# Patient Record
Sex: Female | Born: 1955 | Race: White | Hispanic: No | Marital: Married | Smoking: Never smoker
Health system: Southern US, Community
[De-identification: ages and names within clinical notes are randomized; demographics above are authoritative.]

## PROBLEM LIST (undated history)

## (undated) DIAGNOSIS — E785 Hyperlipidemia, unspecified: Secondary | ICD-10-CM

## (undated) DIAGNOSIS — R011 Cardiac murmur, unspecified: Secondary | ICD-10-CM

## (undated) DIAGNOSIS — E039 Hypothyroidism, unspecified: Secondary | ICD-10-CM

## (undated) DIAGNOSIS — C4491 Basal cell carcinoma of skin, unspecified: Secondary | ICD-10-CM

## (undated) DIAGNOSIS — I10 Essential (primary) hypertension: Secondary | ICD-10-CM

## (undated) DIAGNOSIS — R55 Syncope and collapse: Secondary | ICD-10-CM

## (undated) HISTORY — PX: BACK SURGERY: SHX140

## (undated) HISTORY — PX: MOHS SURGERY: SUR867

## (undated) HISTORY — PX: TUBAL LIGATION: SHX77

---

## 1974-05-22 HISTORY — PX: APPENDECTOMY: SHX54

## 1991-05-23 HISTORY — PX: ABDOMINAL HYSTERECTOMY: SHX81

## 1998-09-30 ENCOUNTER — Ambulatory Visit: Admission: RE | Admit: 1998-09-30 | Discharge: 1998-09-30 | Payer: Self-pay | Admitting: General Surgery

## 1998-10-01 ENCOUNTER — Encounter: Payer: Self-pay | Admitting: General Surgery

## 1998-10-01 ENCOUNTER — Ambulatory Visit (HOSPITAL_COMMUNITY): Admission: RE | Admit: 1998-10-01 | Discharge: 1998-10-01 | Payer: Self-pay | Admitting: General Surgery

## 1998-10-11 ENCOUNTER — Ambulatory Visit (HOSPITAL_COMMUNITY): Admission: RE | Admit: 1998-10-11 | Discharge: 1998-10-11 | Payer: Self-pay | Admitting: General Surgery

## 1998-10-11 ENCOUNTER — Encounter: Payer: Self-pay | Admitting: General Surgery

## 1998-10-14 ENCOUNTER — Emergency Department (HOSPITAL_COMMUNITY): Admission: EM | Admit: 1998-10-14 | Discharge: 1998-10-14 | Payer: Self-pay | Admitting: Emergency Medicine

## 1999-04-13 ENCOUNTER — Inpatient Hospital Stay (HOSPITAL_COMMUNITY): Admission: AD | Admit: 1999-04-13 | Discharge: 1999-04-16 | Payer: Self-pay | Admitting: *Deleted

## 1999-04-15 ENCOUNTER — Encounter: Payer: Self-pay | Admitting: Neurology

## 1999-10-05 ENCOUNTER — Encounter: Admission: RE | Admit: 1999-10-05 | Discharge: 1999-10-05 | Payer: Self-pay | Admitting: Family Medicine

## 1999-10-05 ENCOUNTER — Encounter: Payer: Self-pay | Admitting: Family Medicine

## 2000-11-23 ENCOUNTER — Encounter: Payer: Self-pay | Admitting: Family Medicine

## 2000-11-23 ENCOUNTER — Encounter: Admission: RE | Admit: 2000-11-23 | Discharge: 2000-11-23 | Payer: Self-pay | Admitting: Family Medicine

## 2000-12-05 ENCOUNTER — Ambulatory Visit (HOSPITAL_COMMUNITY): Admission: RE | Admit: 2000-12-05 | Discharge: 2000-12-05 | Payer: Self-pay | Admitting: Family Medicine

## 2000-12-05 ENCOUNTER — Encounter: Payer: Self-pay | Admitting: Family Medicine

## 2001-11-24 ENCOUNTER — Emergency Department (HOSPITAL_COMMUNITY): Admission: EM | Admit: 2001-11-24 | Discharge: 2001-11-24 | Payer: Self-pay | Admitting: Emergency Medicine

## 2002-01-16 ENCOUNTER — Encounter: Admission: RE | Admit: 2002-01-16 | Discharge: 2002-01-16 | Payer: Self-pay | Admitting: Family Medicine

## 2002-01-16 ENCOUNTER — Encounter: Payer: Self-pay | Admitting: Family Medicine

## 2002-06-24 ENCOUNTER — Encounter: Payer: Self-pay | Admitting: Family Medicine

## 2002-06-24 ENCOUNTER — Ambulatory Visit (HOSPITAL_COMMUNITY): Admission: RE | Admit: 2002-06-24 | Discharge: 2002-06-24 | Payer: Self-pay | Admitting: Family Medicine

## 2003-04-08 ENCOUNTER — Encounter: Admission: RE | Admit: 2003-04-08 | Discharge: 2003-04-08 | Payer: Self-pay | Admitting: Family Medicine

## 2003-09-24 ENCOUNTER — Other Ambulatory Visit: Admission: RE | Admit: 2003-09-24 | Discharge: 2003-09-24 | Payer: Self-pay | Admitting: Family Medicine

## 2004-05-11 ENCOUNTER — Ambulatory Visit (HOSPITAL_COMMUNITY): Admission: RE | Admit: 2004-05-11 | Discharge: 2004-05-11 | Payer: Self-pay | Admitting: Family Medicine

## 2005-03-16 ENCOUNTER — Other Ambulatory Visit: Admission: RE | Admit: 2005-03-16 | Discharge: 2005-03-16 | Payer: Self-pay | Admitting: Family Medicine

## 2005-06-02 ENCOUNTER — Ambulatory Visit (HOSPITAL_COMMUNITY): Admission: RE | Admit: 2005-06-02 | Discharge: 2005-06-02 | Payer: Self-pay | Admitting: Family Medicine

## 2006-05-01 ENCOUNTER — Emergency Department (HOSPITAL_COMMUNITY): Admission: EM | Admit: 2006-05-01 | Discharge: 2006-05-01 | Payer: Self-pay | Admitting: Emergency Medicine

## 2006-06-14 ENCOUNTER — Ambulatory Visit (HOSPITAL_COMMUNITY): Admission: RE | Admit: 2006-06-14 | Discharge: 2006-06-14 | Payer: Self-pay | Admitting: Family Medicine

## 2006-07-04 ENCOUNTER — Encounter: Admission: RE | Admit: 2006-07-04 | Discharge: 2006-07-04 | Payer: Self-pay | Admitting: Family Medicine

## 2006-09-13 ENCOUNTER — Encounter: Admission: RE | Admit: 2006-09-13 | Discharge: 2006-11-07 | Payer: Self-pay | Admitting: Family Medicine

## 2007-03-13 ENCOUNTER — Other Ambulatory Visit: Admission: RE | Admit: 2007-03-13 | Discharge: 2007-03-13 | Payer: Self-pay | Admitting: Family Medicine

## 2007-07-08 ENCOUNTER — Ambulatory Visit (HOSPITAL_COMMUNITY): Admission: RE | Admit: 2007-07-08 | Discharge: 2007-07-08 | Payer: Self-pay | Admitting: Family Medicine

## 2007-09-28 ENCOUNTER — Encounter: Admission: RE | Admit: 2007-09-28 | Discharge: 2007-09-28 | Payer: Self-pay | Admitting: Family Medicine

## 2007-10-04 ENCOUNTER — Encounter: Admission: RE | Admit: 2007-10-04 | Discharge: 2007-10-04 | Payer: Self-pay | Admitting: Neurological Surgery

## 2007-10-21 HISTORY — PX: LAMINECTOMY AND MICRODISCECTOMY THORACIC SPINE: SHX1915

## 2007-10-30 ENCOUNTER — Ambulatory Visit: Payer: Self-pay | Admitting: Thoracic Surgery

## 2007-11-15 ENCOUNTER — Inpatient Hospital Stay (HOSPITAL_COMMUNITY): Admission: RE | Admit: 2007-11-15 | Discharge: 2007-11-20 | Payer: Self-pay | Admitting: Neurosurgery

## 2007-11-21 ENCOUNTER — Ambulatory Visit: Payer: Self-pay | Admitting: Thoracic Surgery

## 2007-11-24 ENCOUNTER — Emergency Department (HOSPITAL_COMMUNITY): Admission: EM | Admit: 2007-11-24 | Discharge: 2007-11-24 | Payer: Self-pay | Admitting: Emergency Medicine

## 2007-11-27 ENCOUNTER — Ambulatory Visit: Payer: Self-pay | Admitting: Thoracic Surgery

## 2007-12-04 ENCOUNTER — Encounter: Admission: RE | Admit: 2007-12-04 | Discharge: 2007-12-04 | Payer: Self-pay | Admitting: Thoracic Surgery

## 2007-12-04 ENCOUNTER — Ambulatory Visit: Payer: Self-pay | Admitting: Thoracic Surgery

## 2007-12-18 ENCOUNTER — Ambulatory Visit: Payer: Self-pay | Admitting: Thoracic Surgery

## 2007-12-18 ENCOUNTER — Encounter: Admission: RE | Admit: 2007-12-18 | Discharge: 2007-12-18 | Payer: Self-pay | Admitting: Thoracic Surgery

## 2007-12-24 ENCOUNTER — Encounter: Admission: RE | Admit: 2007-12-24 | Discharge: 2007-12-24 | Payer: Self-pay | Admitting: Thoracic Surgery

## 2007-12-24 ENCOUNTER — Ambulatory Visit: Payer: Self-pay | Admitting: Thoracic Surgery

## 2008-01-08 ENCOUNTER — Encounter: Admission: RE | Admit: 2008-01-08 | Discharge: 2008-01-08 | Payer: Self-pay | Admitting: Thoracic Surgery

## 2008-01-08 ENCOUNTER — Ambulatory Visit: Payer: Self-pay | Admitting: Thoracic Surgery

## 2008-01-29 ENCOUNTER — Ambulatory Visit: Payer: Self-pay | Admitting: Thoracic Surgery

## 2008-01-29 ENCOUNTER — Encounter: Admission: RE | Admit: 2008-01-29 | Discharge: 2008-01-29 | Payer: Self-pay | Admitting: Thoracic Surgery

## 2008-03-11 ENCOUNTER — Ambulatory Visit: Payer: Self-pay | Admitting: Thoracic Surgery

## 2008-03-11 ENCOUNTER — Encounter: Admission: RE | Admit: 2008-03-11 | Discharge: 2008-03-11 | Payer: Self-pay | Admitting: Thoracic Surgery

## 2008-05-13 ENCOUNTER — Ambulatory Visit: Payer: Self-pay | Admitting: Thoracic Surgery

## 2008-05-13 ENCOUNTER — Encounter: Admission: RE | Admit: 2008-05-13 | Discharge: 2008-05-13 | Payer: Self-pay | Admitting: Thoracic Surgery

## 2008-07-07 ENCOUNTER — Other Ambulatory Visit: Admission: RE | Admit: 2008-07-07 | Discharge: 2008-07-07 | Payer: Self-pay | Admitting: Family Medicine

## 2008-07-09 ENCOUNTER — Ambulatory Visit (HOSPITAL_COMMUNITY): Admission: RE | Admit: 2008-07-09 | Discharge: 2008-07-09 | Payer: Self-pay | Admitting: Family Medicine

## 2008-07-23 ENCOUNTER — Ambulatory Visit (HOSPITAL_COMMUNITY): Admission: RE | Admit: 2008-07-23 | Discharge: 2008-07-23 | Payer: Self-pay | Admitting: Family Medicine

## 2009-07-16 ENCOUNTER — Ambulatory Visit (HOSPITAL_COMMUNITY): Admission: RE | Admit: 2009-07-16 | Discharge: 2009-07-16 | Payer: Self-pay | Admitting: Family Medicine

## 2010-06-12 ENCOUNTER — Encounter: Payer: Self-pay | Admitting: Family Medicine

## 2010-06-13 ENCOUNTER — Encounter: Payer: Self-pay | Admitting: Thoracic Surgery

## 2010-06-22 HISTORY — PX: BASAL CELL CARCINOMA EXCISION: SHX1214

## 2010-07-05 ENCOUNTER — Inpatient Hospital Stay (HOSPITAL_COMMUNITY)
Admission: EM | Admit: 2010-07-05 | Discharge: 2010-07-08 | DRG: 278 | Disposition: A | Discharge status: Home / Self Care | Payer: BC Managed Care – PPO | Attending: Internal Medicine | Admitting: Internal Medicine

## 2010-07-05 ENCOUNTER — Emergency Department (HOSPITAL_COMMUNITY): Payer: BC Managed Care – PPO

## 2010-07-05 DIAGNOSIS — L02219 Cutaneous abscess of trunk, unspecified: Principal | ICD-10-CM | POA: Diagnosis present

## 2010-07-05 DIAGNOSIS — C44519 Basal cell carcinoma of skin of other part of trunk: Secondary | ICD-10-CM | POA: Diagnosis present

## 2010-07-05 DIAGNOSIS — E039 Hypothyroidism, unspecified: Secondary | ICD-10-CM | POA: Diagnosis present

## 2010-07-05 DIAGNOSIS — Z79890 Hormone replacement therapy (postmenopausal): Secondary | ICD-10-CM

## 2010-07-05 DIAGNOSIS — F3289 Other specified depressive episodes: Secondary | ICD-10-CM | POA: Diagnosis present

## 2010-07-05 DIAGNOSIS — A4901 Methicillin susceptible Staphylococcus aureus infection, unspecified site: Secondary | ICD-10-CM | POA: Diagnosis present

## 2010-07-05 DIAGNOSIS — Z79899 Other long term (current) drug therapy: Secondary | ICD-10-CM

## 2010-07-05 DIAGNOSIS — Z808 Family history of malignant neoplasm of other organs or systems: Secondary | ICD-10-CM

## 2010-07-05 DIAGNOSIS — L03319 Cellulitis of trunk, unspecified: Principal | ICD-10-CM | POA: Diagnosis present

## 2010-07-05 DIAGNOSIS — I10 Essential (primary) hypertension: Secondary | ICD-10-CM | POA: Diagnosis present

## 2010-07-05 DIAGNOSIS — C44711 Basal cell carcinoma of skin of unspecified lower limb, including hip: Secondary | ICD-10-CM | POA: Diagnosis present

## 2010-07-05 DIAGNOSIS — E7800 Pure hypercholesterolemia, unspecified: Secondary | ICD-10-CM | POA: Diagnosis present

## 2010-07-05 DIAGNOSIS — F329 Major depressive disorder, single episode, unspecified: Secondary | ICD-10-CM | POA: Diagnosis present

## 2010-07-05 LAB — DIFFERENTIAL
Basophils Relative: 0 % (ref 0–1)
Eosinophils Absolute: 0.2 10*3/uL (ref 0.0–0.7)
Eosinophils Relative: 1 % (ref 0–5)
Lymphs Abs: 2 10*3/uL (ref 0.7–4.0)
Monocytes Relative: 8 % (ref 3–12)
Neutrophils Relative %: 72 % (ref 43–77)

## 2010-07-05 LAB — BASIC METABOLIC PANEL
BUN: 6 mg/dL (ref 6–23)
Creatinine, Ser: 0.67 mg/dL (ref 0.4–1.2)
GFR calc Af Amer: >60 mL/min (ref >60)
GFR calc non Af Amer: >60 mL/min (ref >60)

## 2010-07-05 LAB — CBC
MCH: 32.3 pg (ref 26.0–34.0)
MCV: 93.8 fL (ref 78.0–100.0)
Platelets: 255 10*3/uL (ref 150–400)
RDW: 12.2 % (ref 11.5–15.5)
WBC: 10.8 10*3/uL — ABNORMAL HIGH (ref 4.0–10.5)

## 2010-07-05 LAB — HEPATIC FUNCTION PANEL
Albumin: 3.9 g/dL (ref 3.5–5.2)
Indirect Bilirubin: 0.3 mg/dL (ref 0.3–0.9)
Total Protein: 7.9 g/dL (ref 6.0–8.3)

## 2010-07-06 LAB — CBC
HCT: 33.8 % — ABNORMAL LOW (ref 36.0–46.0)
Hemoglobin: 11.5 g/dL — ABNORMAL LOW (ref 12.0–15.0)
RBC: 3.6 MIL/uL — ABNORMAL LOW (ref 3.87–5.11)
WBC: 9.6 10*3/uL (ref 4.0–10.5)

## 2010-07-06 LAB — BASIC METABOLIC PANEL
CO2: 28 mEq/L (ref 19–32)
Chloride: 101 mEq/L (ref 96–112)
GFR calc non Af Amer: >60 mL/min (ref >60)
Glucose, Bld: 107 mg/dL — ABNORMAL HIGH (ref 70–99)
Potassium: 4.1 mEq/L (ref 3.5–5.1)
Sodium: 137 mEq/L (ref 135–145)

## 2010-07-07 LAB — CBC
HCT: 35.2 % — ABNORMAL LOW (ref 36.0–46.0)
Hemoglobin: 12 g/dL (ref 12.0–15.0)
MCH: 32 pg (ref 26.0–34.0)
RBC: 3.75 MIL/uL — ABNORMAL LOW (ref 3.87–5.11)

## 2010-07-08 LAB — CBC
Hemoglobin: 12.2 g/dL (ref 12.0–15.0)
MCV: 94.1 fL (ref 78.0–100.0)
Platelets: 270 10*3/uL (ref 150–400)
RBC: 3.88 MIL/uL (ref 3.87–5.11)
WBC: 6.4 10*3/uL (ref 4.0–10.5)

## 2010-07-08 LAB — COMPREHENSIVE METABOLIC PANEL
AST: 27 U/L (ref 0–37)
Albumin: 3.1 g/dL — ABNORMAL LOW (ref 3.5–5.2)
Alkaline Phosphatase: 117 U/L (ref 39–117)
BUN: 11 mg/dL (ref 6–23)
CO2: 28 mEq/L (ref 19–32)
Chloride: 102 mEq/L (ref 96–112)
GFR calc non Af Amer: >60 mL/min (ref >60)
Potassium: 4.1 mEq/L (ref 3.5–5.1)
Total Bilirubin: 0.4 mg/dL (ref 0.3–1.2)

## 2010-07-08 LAB — WOUND CULTURE

## 2010-07-08 LAB — DIFFERENTIAL
Basophils Absolute: 0 10*3/uL (ref 0.0–0.1)
Eosinophils Absolute: 0.1 10*3/uL (ref 0.0–0.7)
Eosinophils Relative: 1 % (ref 0–5)
Lymphocytes Relative: 29 % (ref 12–46)

## 2010-07-12 LAB — CULTURE, BLOOD (ROUTINE X 2)
Culture  Setup Time: 201202150130
Culture: NO GROWTH
Culture: NO GROWTH

## 2010-07-16 NOTE — H&P (Signed)
NAMELEANDRIA, THIER NO.:  [id number]  MEDICAL RECORD NO.:  [id number]           PATIENT TYPE:  E  LOCATION:  WLED                         FACILITY:  [facility]  PHYSICIAN:  Vania Rea, M.D. DATE OF BIRTH:  March 06, 1956  DATE OF ADMISSION:  07/05/2010 DATE OF DISCHARGE:                             HISTORY & PHYSICAL   PRIMARY CARE PHYSICIAN:  Dr. Merri Brunette.  DERMATOLOGIST:  Dr. Londell Moh.  CHIEF COMPLAINT:  Fever and chills, progressively worsening back pain for 3 days.  HISTORY OF PRESENT ILLNESS:  This is a 55 year old Caucasian lady who underwent excision of the basal cell carcinoma over her right scapula on Thursday last.  The patient was doing well until Saturday 3 days ago when she started having fever and chills and not feeling well.  She visited her doctor yesterday and was comforted that systemic reaction was soon resolved.  However, last night, she woke with foul-smelling purulent discharge from the incision site over her right scapula.  She contacted her dermatologist who advised her to come to the emergency room for evaluation.  Other than the above symptoms, the patient denies any other acute problem.  She denies headache, nausea, or vomiting.  She denies cough or cold frequency, or dysuria.  The patient does have other basal cell cancers on her body, which are pending excision, one on her left flank and one just above her right ankle.  PAST MEDICAL HISTORY: 1. Hyperlipidemia. 2. Hypothyroidism. 3. Hypertension. 4. Depression.  PAST SURGICAL HISTORY:  She is status post appendectomy, status post partial hysterectomy, status post T12-T11 decompression surgery in July 2009, that surgery required a thoracotomy.  She is status post recent excision of basal cell cancer over the right scapula.  MEDICATIONS: 1. Crestor 20 mg daily. 2. Effexor 37.5 mg daily. 3. Estradiol 1 mg daily. 4. Lisinopril 5 mg at bedtime. 5. Synthroid 112 mcg  daily.  ALLERGIES: 1. CODEINE causes nausea and vomiting. 2. VANCOMYCIN causes swelling of the face and throat when she received     it in the emergency room on November 24, 2007.  SOCIAL HISTORY:  Denies tobacco, alcohol, or illicit drug use.  She works as a Conservation officer, nature, married, lives with her husband.  She has 2 grown children, living away.  She has been using a tanning bed frequently and as a result, was recently found to have 3 basal cell cancers as noted above.  FAMILY HISTORY:  Significant for mother who is a smoker and died of lung cancer.  Brothers and sisters with thyroid problems.  One sister died after multiple illnesses associated with autoimmune deficiency discovered in childhood.  She has 5 aunts with gynecological cancers including ovary and breast and also bowel cancer.  REVIEW OF SYSTEMS:  Other than noted above unremarkable.  The patient notes that she is up-to-date with all her cancer screening including Pap smears, mammograms, and colonoscopies.  PHYSICAL EXAMINATION:  GENERAL:  A very pleasant middle-aged Caucasian lady, reclining in the stretcher, somewhat uncomfortable because of pain in the upper back. VITALS:  Temperature 97.4, pulse 72, respirations 20, and blood pressure 145/78.  She is saturating at 98% on room air. HEENT:  Her pupils are round, equal, and reactive.  Mucous membranes are pink, anicteric. NECK:  No cervical lymphadenopathy or thyromegaly.  No carotid bruit. CHEST:  Clear to auscultation bilaterally. CARDIOVASCULAR SYSTEM:  Regular rhythm.  No murmur. ABDOMEN:  Obese, soft, and nontender. EXTREMITIES:  Without edema.  She does have mild arthritic deformities of the left knee.  Dorsalis pedis pulse was 2+ bilaterally. SKIN:  She has healing incision over the right scapula about 2 inches in length.  The surrounding area is red, inflamed, and slightly swollen extending about 3 cm beyond the incision in every direction.  There is no purulent  discharge seen.  She has healing wound in her left flank, which she says is where she has had basal cell cancer biopsied in the similar wound just above her right ankle.  Her skin is coarse and dry. She has scars on her left flank, status post thoracotomy for back surgery. CENTRAL NERVOUS SYSTEM:  Cranial nerves II through XII are grossly intact.  She has no focal lateralizing signs.  LABS:  White count is 10.8, hemoglobin 12.9, and platelets 255.  She has a normal differential.  Her sodium is 138, potassium 3.9, chloride 100, CO2 of 28, glucose 99, BUN 6, creatinine 0.67, and calcium 9.7.  ASSESSMENT: 1. Cellulitis over the right shoulder. 2. Hypertension. 3. Hypothyroidism. 4. Depression. 5. Basal cell carcinoma.  PLAN: 1. We will admit this lady for antibiotic therapy until there is     definite evidence of improving, although she has already had a dose     of clindamycin.  We will go ahead and get blood cultures and we     will attempt to get some     wound discharge for culture and sensitivities, all has been on 14th     since this lady is allergic to vancomycin.  We will start her     initially on Zosyn and Tygacil, pending results of any available     cultures. 2. Other plans as per orders.     Vania Rea, M.D.     LC/MEDQ  D:  07/05/2010  T:  07/05/2010  Job:  [id number]  cc:   Dario Guardian, M.D. Fax: [phone]  Dr. Londell Moh.  Electronically Signed by Vania Rea M.D. on 07/16/2010 03:56:44 AM

## 2010-08-01 NOTE — Discharge Summary (Signed)
[name], Michelle [place]                  ACCOUNT NO.:  [id number]  MEDICAL RECORD NO.:  [id number]           PATIENT TYPE:  I  LOCATION:  1308                         FACILITY:  [facility]  PHYSICIAN:  Pincus Large, MD     DATE OF BIRTH:  12/12/1955  DATE OF ADMISSION:  07/05/2010 DATE OF DISCHARGE:  07/08/2010                              DISCHARGE SUMMARY   PRIMARY CARE PHYSICIAN:  Dario Guardian, MD  DERMATOLOGIST:  Norval Gable. [place], MD  REASON FOR ADMISSION:  Fever, chills, progressive back pain for 3 days.  DISCHARGE DIAGNOSES: 1. Cellulitis of the right scapular area with methicillin-susceptible     Staphylococcus aureus, status post excision of the basal cell. 2. Hypertension history. 3. History of hypothyroid. 4. History of hypercholesterolemia.  DISCHARGE MEDICATIONS: 1. Cipro 500 mg p.o. b.i.d. for 10 days. 2. Lisinopril 5 mg daily. 3. Effexor XR 37.5 mg daily. 4. Estradiol 1 mg daily. 5. Crestor 20 mg daily. 6. Synthroid 112 mcg daily. 7. Tramadol 50 mg, post 25 mg q.6 p.r.n.  IMAGES DONE:  X-ray of the chest was stable left basilar pleural thickening, no evidence of acute focal.  BRIEF HOSPITAL COURSE:  She is a 55 year old Caucasian female who underwent excision of the basal cell carcinoma of the right scapula on Thursday last.  She was doing fine until Saturday, 3 days ago, she was started having fever, chills and she visited her doctor and was sent to the ER.  The patient had a foul-smelling purulent discharge from the incision site over her right scapula.  She came to the emergency room. She was started on IV Zosyn and IV Tygacil.  Culture was done which grew MSSA Staphylococcus aureus which was sensitive to Cipro, oxacillin, gentamicin, erythromycin, clindamycin, rifampin, Bactrim, and resistant to penicillin.  The patient's WBC went down from 10 to 8.  She wants to go home today.  I will discharge the patient on p.o. Cipro to follow up with her  dermatologist next Tuesday.  Vital signs on discharge, temperature 98.4, pulse is 67, respirations 18, blood pressure is 127/77, O2 saturating 90%.  LABORATORY DATA:  On discharge, WBCs at 6.4 from 10.8, hemoglobin 12.2, platelet is 270.  Her sodium is 137, potassium is 4.1, chloride is 104, bicarbonate is 28, glucose 103.  Her AST is 27, ALT is 45, total protein is 6.2, albumin is 3.1, and calcium is 8.4.  HOSPITAL COURSE: 1. Cellulitis of the right scapular area, status post excision of the     basal cell.  Continue with Cipro 500 p.o. b.i.d. for 10 days.     Follow up with dermatologist next week. 2. Hypertension, continue with lisinopril. 3. Hypothyroid, continue with levothyroxine. 4. Hypercholesterolemia, continue with simvastatin.  DISPOSITION:  Home.  INSTRUCTIONS:  Follow up with dermatologist, Dr. Sharyn Lull, in next week.  Diet, 2 g sodium.  Time spent on discharge summary was 35.          ______________________________ Pincus Large, MD     SA/MEDQ  D:  07/08/2010  T:  07/08/2010  Job:  [id number]  Electronically Signed by Granville Lewis Diamon Reddinger  MD on 08/01/2010 05:58:10 PM

## 2010-10-04 NOTE — Op Note (Signed)
NAMELEXUS, SHAMPINE NO.:  [id number]   MEDICAL RECORD NO.:  [id number]          PATIENT TYPE:  INP   LOCATION:  3102                         FACILITY:  [facility]   PHYSICIAN:  Kathaleen Maser. Pool, M.D.    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   PROCEDURE NOTE:  Left sided T11-12 transthoracic microdiskectomy.   SURGEON:  Kathaleen Maser. Pool, MD   ASSISTANT:  Tia Alert, MD   THORACIC SURGEON:  Dr. Luster Landsberg.   ANESTHESIA:  General endotracheal.   INDICATIONS:  Ms. Haffey is a 55 year old female with history of severe  back and bilateral lower extremity symptoms consistent with lower  thoracic myelopathy.  Workup demonstrates evidence of very large T11-12  disk herniation with marked spinal cord compression.  The patient has  been counseled as to her options.  She decided to proceed with a left-  sided transthoracic microdiskectomy in hopes of improving her symptoms.   OPERATIVE NOTE:  The patient was placed on the operative table in supine  position.  After adequate level of anesthesia, the patient was  positioned in the right lateral decubitus position and appropriately  padded.  The patient's left chest was prepped and draped sterilely.  Dr.  Edwyna Shell then incised with scanner overlying the eighth interspace and  performed a thoracotomy through this incision.  Retractors were placed.  The diaphragm was gently retracted inferiorly and the T11-12 disk space  was identified.  The pleura was carefully dissected from the underlying  rib heads of T11-T12, as well as the vertebral bodies of T11 and T12.  An x-ray was taken and the T11-12 disk space was confirmed.  Diskectomy  was then performed using  pituitary rongeurs, Kerrison rongeurs, and a  high-speed drill to remove the posterior one-third of the disk at T11-  12.  This was widened into the posterior aspect of the body in a  trumpeted fashion into both the T11-T12.  Microscope was then brought  into the field using microdissection.  The posterior longitudinal  ligament was then identified.  Posterior longitudinal ligament was then  elevated and resected in piecemeal fashion.  Underlying thecal sac was  then identified.  A wide central decompression was then performed by  undercutting the bodies of T11 and T12.  Dissection then proceeded from  a lateral to medial direction.  The calcified disk herniation became  apparent.  It was carefully removed by drilling around the disk  herniation and fracturing through the enveloping osteophytes.  The disk  herniation was removed in one large piece.  The canal was further  inspected and the diskectomy was completed.  There is no evidence of any  injury to the thecal sac or nerve roots.  There is no evidence of any  residual compression upon the thecal sac nerve roots.  The wound was  then  irrigated with antibiotic solution.  Gelfoam was placed topically for  hemostasis and found to be good.  Dr. Edwyna Shell returned and closed the  chest in typical fashion.  There were no complications.  The patient  tolerated the procedure well, and she returned to the recovery room  postoperatively.           ______________________________  Kathaleen Maser Pool, M.D.     HAP/MEDQ  D:  11/15/2007  T:  11/15/2007  Job:  [id number]

## 2010-10-04 NOTE — Letter (Signed)
January 08, 2008     Re:  Michelle Kent, Michelle Kent              DOB:  August 09, 1955     The patient came today and unfortunately she has had a partial  recurrence of her left pleural effusion since we did the thoracentesis.  She is simply asymptomatic.  Her sats were 95%.  Her blood pressure is  129/75, her pulse 85, and respirations 18.  I am going to watch her  another couple of weeks and if it does not improve, she will probably  will have to have another thoracentesis.  Ultimately, it will improve on  its own in the interim.   Ines Bloomer, M.D.  Electronically Signed   DPB/MEDQ  D:  01/08/2008  T:  01/09/2008  Job:  [id number]

## 2010-10-04 NOTE — Letter (Signed)
November 27, 2007   Henry A. Pool, M.D.  [address]  [place] [address]   Re:  PALIN, TRISTAN              DOB:  August 09, 1955   Dear Sherilyn Cooter,   I saw the patient in the office today.  I reviewed her CT scan that she  had done in the emergency room.  Apparently, she was started on  antibiotics for questionable empyema and had a fairly severe allergic  reaction probably to the Zosyn.  Her blood pressure was 122/75, pulse  96, respirations 16, and sats were 96%.  Her incision is well-healed.  She does have some loculated effusion on the left side, but I think this  is normal postoperative changes.  She is breathing much better and  feeling better in the last couple of days.  I will plan to see her back  again in 2 weeks with a chest x-ray to follow the fluid.  I told her to  let me know immediately should she have any problems as I will evaluate  her in the emergency room myself given her bad experience at the last  time.   Ines Bloomer, M.D.  Electronically Signed   DPB/MEDQ  D:  11/27/2007  T:  11/28/2007  Job:  [id number]

## 2010-10-04 NOTE — Letter (Signed)
December 18, 2007   Sherilyn Cooter A. Pool, MD  [address]  [place], [place] [address]   Re:  Michelle Kent, Michelle Kent              DOB:  January 04, 1956   Dear Sherilyn Cooter,   I saw the patient back today and unfortunately, she has developed an  increasing left pleural effusion and she also has had an increasing  cough.  On examination, her blood pressure is 121/70, pulse 86,  respirations 18, and sats were 98%.  There is decreased breath sounds on  the left side.  Her incision was well healed.  We decided to do a  thoracentesis, so we drained 900 mL of fluid out of the left chest.  She  tolerated the procedure well.  I plan to see her back on next Tuesday  with another chest x-ray.  I put her on a Medrol Dosepak for her  coughing as well as hopefully, it will reduce the inflammation of her  effusion and also I put her on Tussionex for her cough.   Ines Bloomer, M.D.  Electronically Signed   DPB/MEDQ  D:  12/18/2007  T:  12/19/2007  Job:  [id number]

## 2010-10-04 NOTE — Assessment & Plan Note (Signed)
OFFICE VISIT   TAMBI, THOLE  DOB:  Oct 01, 1955                                        January 29, 2008  CHART #:  [id number]   The patient came today.  Her blood pressure is 144/79, pulse 82,  respirations 18, and sats were 99%.  Lungs were clear to auscultation  and percussion.  There was slight decreased breath sounds on the left.  Her chest x-ray shows the stable loculated effusion.  I think it is  actually improved somewhat.  She is having no symptoms.  This is all  just postoperative changes.  We are just going to have to follow this  and I think it should resolve over a period of time.  I will see him  again in 6 weeks with a chest x-ray.  She will let us know if she has  any pulmonary problems prior to then.   Ines Bloomer, M.D.  Electronically Signed   DPB/MEDQ  D:  01/29/2008  T:  01/30/2008  Job:  [id number]   cc:   Sherilyn Cooter A. Pool, M.D.

## 2010-10-04 NOTE — Letter (Signed)
May 13, 2008   Sherilyn Cooter A. Pool, MD  [address] [place] [address]   Re:  Michelle, Kent              DOB:  [birth year]   Dear Dr. Jordan Likes:   The patient comes in today.  She is doing well overall.  We rechecked  her chest x-ray and there is a marked decrease in the left lower lobe  effusion and just a mild residual atelectasis there.  She is doing well  overall, looks great.  We will not need to see her back again.  If she  has any further problems, I will be happy to see her at anytime.   Ines Bloomer, M.D.  Electronically Signed   DPB/MEDQ  D:  05/13/2008  T:  05/13/2008  Job:  [id number]

## 2010-10-04 NOTE — Letter (Signed)
October 30, 2007   Sherilyn Cooter A. Pool, M.D.  [address] [place] [place] [address]   Re:  GHADEER, KASTELIC              DOB:  1956-04-10   I saw Ms. Vidrine in the office today.  This 55 year old Caucasian female  presented with three month history of progressive left leg pain and left  lower back pain.  She has had no problems as far as bowel or bladder  changes.  An MRI revealed that she had a herniation of her T11-T12 disk,  and she is scheduled for a diskectomy with diskectomy of the T11-T12.  We were asked to see her to do the exposure of T11-T12through a left  thoracotomy.   ALLERGIES:  She is allergic to codeine.   MEDICATIONS:  Include Synthroid 1 daily,  Estradiol, Aciphex, Tramadol,  Tylenol.  All dosage unknown.   She had previous surgeries to  include hysterectomy and appendectomy,  and she has been treated for hypothyroidism.   SOCIAL HISTORY:  Married, has 2 children.  Works in [employer].  Does  not smoke or drink.   REVIEW OF SYSTEMS:  She is 100.9 pounds.  She is 5 feet 2.  In general,  she has had no weight loss.  CARDIAC:  No angina or atrial fibrillation.  PULMONARY:  No bronchitis, asthma, wheezing, or hemoptysis.  GI:  No  nausea, vomiting, constipation, or diarrhea.  No GERD.  GU:  No kidney  disease or frequent urination.  VASCULAR:  No claudication, DVT, TIAs.  NEUROLOGIC:  No dizziness, headaches, blackouts, or seizures.  MUSCULOSKELETAL:  See history of present illness.  Psychiatric:  No  depression or nervous.  EYE/ENT:  No change in her eyesight or hearing.  HEMATOLOGICAL:  No problems with bleeding or clotting disorders.   PHYSICAL EXAMINATION:  VITAL SIGNS:  Her blood pressure is 149/85, pulse  75, respirations 16, sats were 96%.  HEAD, EYES, EARS, NOSE, AND THROAT:  Unremarkable.  NECK:  Supple without thyromegaly.  CHEST:  Clear to auscultation and percussion.  HEART:  Regular sinus rhythm.  No murmurs.  ABDOMEN:  Soft.  There is no  hepatosplenomegaly.  Bowel sounds normal.  EXTREMITIES:  Pulses are 2+.  There is no clubbing or edema.  NEUROLOGICAL:  She is oriented x3.  Sensory and motor intact.  Cranial  nerves intact.   PLAN:  I will plan to do a T9-10 incision to expose the 11/12 and  hopefully will not have to take the diaphragm down very much.  I  explained the risks and benefits of the procedure and she understands.  We will do at [facility] on June 28.   Ines Bloomer, M.D.  Electronically Signed   DPB/MEDQ  D:  10/30/2007  T:  10/31/2007  Job:  [id number]

## 2010-10-04 NOTE — Assessment & Plan Note (Signed)
OFFICE VISIT   KAIRY, FOLSOM  DOB:  Oct 21, 1955                                        January 08, 2008  CHART #:  [id number]   The patient came today and unfortunately she has had a partial  recurrence of her left pleural effusion since we did the thoracentesis.  She is simply asymptomatic.  Her sats were 95%.  Her blood pressure is  129/75, her pulse 85, and respirations 18.  I am going to watch her  another couple of weeks and if it does not improve, she will probably  will have to have another thoracentesis.  Ultimately, it will improve on  its own in the interim.   Ines Bloomer, M.D.  Electronically Signed   DPB/MEDQ  D:  01/08/2008  T:  01/18/2008  Job:  [id number]

## 2010-10-04 NOTE — Op Note (Signed)
NAMEDIAMONE, WHISTLER NO.:  [id number]   MEDICAL RECORD NO.:  [id number]          PATIENT TYPE:  INP   LOCATION:  3102                         FACILITY:  [facility]   PHYSICIAN:  Ines Bloomer, M.D. DATE OF BIRTH:  Mar 08, 1956   DATE OF PROCEDURE:  11/15/2007  DATE OF DISCHARGE:                               OPERATIVE REPORT   PREOPERATIVE DIAGNOSIS:  T11-T12 herniated nucleus pulposus.   POSTOPERATIVE DIAGNOSIS:  T11-T12 herniated nucleus pulposus.   OPERATION PERFORMED:  Left thoracotomy for exposure of T11-T12 disk.   SURGEON:  Ines Bloomer, MD.   FIRST ASSISTANT:  Dr. Reginia Forts and Coral Ceo, PA-C.   PROCEDURE:  The patient underwent general anesthesia and was turned to  the left lateral thoracotomy position.  Dual-lumen tube was then  inserted.  The left lung was deflated.  The patient was prepped and  draped in the usual sterile manner, and an incision was made over the  seventh and eighth intercostal space.  Dissection was carried down to  the subcutaneous tissue.  We divided the latissimus and entered the  eighth intercostal space.  TPA was placed in the intercostal space.  The  lung was deflated and packed superiorly.  We then exposed the 11th and  12th interspace and dissected the pleural flap to get down to the  interspace and Dr. Reginia Forts did a diskectomy and after the  diskectomy, closure was obtained with a right-angled chest tube through  a separate stab wound and that was tied in place with 0 silk.  Marcaine  block was done in the usual fashion.  A single On-Q was inserted in the  usual fashion.  The chest was closed with 6 pericostals, #2 Vicryls, #1  Vicryl in the muscle layer, 2-0 Vicryl in the subcutaneous tissue, and  Dermabond for the skin.  The patient was returned to the recovery room  in stable condition.      Ines Bloomer, M.D.  Electronically Signed     DPB/MEDQ  D:  11/15/2007  T:  11/16/2007  Job:  [id number]   cc:   Sherilyn Cooter A. Pool, M.D.

## 2010-10-04 NOTE — Discharge Summary (Signed)
NAMEDELSA, WALDER NO.:  [id number]   MEDICAL RECORD NO.:  [id number]          PATIENT TYPE:  INP   LOCATION:  3034                         FACILITY:  [facility]   PHYSICIAN:  Kathaleen Maser. Pool, M.D.    DATE OF BIRTH:  June 25, 1955   DATE OF ADMISSION:  11/15/2007  DATE OF DISCHARGE:  11/20/2007                               DISCHARGE SUMMARY   FINAL DIAGNOSIS:  T11-12 herniated nucleus pulposus with myelopathy.   OPERATIONS AND TREATMENTS:  Left transthoracic microdiskectomy at T11-  12.   HOSPITAL COURSE:  The patient was admitted to the hospital where she  underwent an uncomplicated left transthoracic microdiskectomy.  Postoperatively, she did quite well.  Back and lower extremity pain were  resolved.  She had the usual amount of post thoracotomy pain.  Her chest  tubes were discontinued over the first few postoperative days.  She had  small residual pneumothorax which was followed with serial x-rays and  not requiring further treatment.  At the time of discharge, the  patient's pain was well-controlled.  She had intact neurological  function.  The wound is healing well.  She will be discharged to home.  She will follow up in my office in 1 week.           ______________________________  Kathaleen Maser. Pool, M.D.     HAP/MEDQ  D:  12/17/2007  T:  12/17/2007  Job:  [id number]

## 2010-10-04 NOTE — Letter (Signed)
December 04, 2007   Sherilyn Cooter A. Pool, MD  [address] [place] [address]   Re:  HARPREET, SIGNORE              DOB:  05-13-1956   Dear Sherilyn Cooter,   I saw the patient back today in the office.  She is feeling well.  Her  sats were 97%, blood pressure is 131/72, pulse 91, and respirations 18.  Unfortunately, she still has a little more loculated fluid than I would  like, but I think it  has slightly resolved and will hold of on doing a  thoracentesis.  I am going to watch this for 2 weeks more and see her  back.  If there is continued fluid at that time, then we will do a  thoracentesis.  Her incision was well healed.  I appreciate the  opportunity of seeing the patient.   Ines Bloomer, M.D.  Electronically Signed   DPB/MEDQ  D:  12/04/2007  T:  12/05/2007  Job:  [id number]

## 2010-10-04 NOTE — Letter (Signed)
March 11, 2008   Sherilyn Cooter A. Pool, MD  [address] [place] [address], [place] [address]   Re:  Michelle Kent, Michelle Kent              DOB:  04-13-1956   Dear Sherilyn Cooter:   I appreciate the opportunity.  I saw the patient back today.  We have  been following her for this persistent effusion that we operated and the  x-ray today shows improvement in the effusion.  It still has lot of  reaction where we operated, but it is improving, so I am planning to see  her back for final check in 2 months with a chest x-ray.  I appreciate  the opportunity of seeing the patient.   Ines Bloomer, M.D.  Electronically Signed   DPB/MEDQ  D:  03/11/2008  T:  03/11/2008  Job:  [id number]

## 2010-10-04 NOTE — Assessment & Plan Note (Signed)
OFFICE VISIT   Michelle Kent, Michelle Kent  DOB:  07-04-1955                                        December 24, 2007  CHART #:  [id number]   She came today and her chest x-ray shows marked resolution since we did  a thoracentesis.  There is a small effusion on the left base.  She is  feeling better.  Her sats are 97%, her blood pressure was 139/81, pulse  90, and respirations 18.  Lungs were clear to auscultation and  percussion.  I will plan to see her back in 2 weeks with a chest x-ray.   Ines Bloomer, M.D.  Electronically Signed   DPB/MEDQ  D:  12/24/2007  T:  12/25/2007  Job:  [id number]

## 2010-10-07 NOTE — Consult Note (Signed)
[place]  Patient:    Michelle Kent                           MRN: [id number] Proc. Date: 04/15/99 Adm. Date:  [id number] Attending:  Meade Maw A                          Consultation Report  PATIENTS ADDRESS: [address] [place], [place]  [address]  DATE OF BIRTH:  Oct 01, 1955.  REASON FOR CONSULTATION:  This 55 year old right-handed white married female is  seen at the request of Meade Maw, M.D. to evaluate dizziness.  HISTORY OF PRESENT ILLNESS:  Mrs. Michelle Kent has no known prior history of high blood pressure, diabetes or heart disease.  About two weeks ago, while in the sitting  position without exercise, she developed chest pain.  She discussed this with Dario Guardian, M.D., her primary care doctor, by telephone, took one Bayer aspirin with improvement in symptoms, and the next day, had a normal 12-lead EKG. She was referred to Meade Maw, M.D. and was to have a stress test.  This past Saturday, while shopping in a mall, she developed dizziness described as a sensation of spinning without definite nausea or tinnitus.  Sunday, she had similar symptoms and since that time, has been having recurrent episodes of vague dizziness, lasting anywhere from three to five minutes, occurring in the standing, sitting and lying positions, not necessarily aggravated by turning her head. There has not been any associated headache or ringing in her ears with this, but she as noted weakness generally while walking and "collapsed" on one occasion when an episode had occurred.  She has not had recurrent chest pain or palpitations, but was admitted on April 13, 1999 to the cardiology service at [facility] by Dr. Meade Maw.  In the hospital, a 12-lead EKG, telemetry and previous as an outpatient, comprehensive metabolic panel, CBC and TSH were normal.  In the hospital, she has had a low potassium of 3.3 with  a sodium of 128.  As an outpatient, studies have been obtained revealing evidence of Helicobacter pylori and she had been started on amoxicillin 500 mg t.i.d., Flagyl 500 mg t.i.d. and  Prilosec 20 mg q.d.; this, however, had been started after she had noted the onset of dizziness.  In addition to dizziness, she has had another vague symptom in her head; she has developed some sharp pains which are hard for her to localize, lasting seconds.  She also has a hot sensation in her head or a vague sensation in her head that comes and goes.  She has not had any head or neck trauma, viral illnesses, alcohol abuse or medicines other than listed above.  Her past medical history is significant for either anxiety or stress with a "nervous breakdown" in 1997, for which she was hospitalized by Francis Dowse A. Claudette Head, M.D., her psychiatrist.  She had a hysterectomy in 1992.  She had an appendectomy in 1976.  She does not smoke cigarettes or drink alcohol.  Other studies in the hospital have included a Doppler study of the carotids and  vertebrals, which was normal.  PHYSICAL EXAMINATION:  GENERAL:  A well-developed, pleasant, anxious-appearing white female with blood  pressure in right and left arms of 120/60, lying in the left arm 120/60, standing in the right arm 120/60, with a heart rate of 72  and regular.  HEENT/NECK:  No bruits were heard.  The tympanic membranes were clear.  Neck flexion and extension maneuvers were unremarkable.  NEUROLOGIC:  She is alert, oriented x 3 and followed one-, two- and three-step commands.  Cranial nerve examination revealed visual fields full, disks flat. Extraocular movements are full.  Corneals are present.  Facial sensation was equal. There was no facial motor asymmetry.  Hearing was intact with air conduction greater than bone conduction.  Tongue was midline.  Uvula was midline and gags ere present.  Sternocleidomastoid and trapezius testing was normal.  She  did have evidence of a right ptosis.  Motor examination revealed good strength in the upper and lower extremities without any evidence of proximal pronator or distal drift. Coordination testing revealed finger-to-nose, heel-to-shin and rapid alternating movements to be normal.  Sensory examination was intact to pinprick, touch, ______ positioning and vibration testing.  The deep tendon reflexes were 1 to 2+ and plantar responses were downgoing.  Gait examination was within normal limits, though the patient did feel unsteady.  Romberg testing was negative and tandem ait was well-performed.  A ______ hanging head maneuver was performed which did not  reproduce the patients symptomatology.  IMPRESSION: 1. Vertigo by history, code 780.4, etiology unknown, possibly on a peripheral    basis. 2. History of chest pain, possibly secondary to peptic ulcer disease. 3. Documented Helicobacter pylori. 4. History of depression versus anxiety, code 311.  PLAN:  The plan at this time is to obtain an MRI study of the brain with and without contrast enhancement and to consider a course of Xanax after the above. It is of note that she had a low serum sodium of 128 and potassium of 3.3, which may be important clues as the etiology of her vertigo. DD:  04/15/99 TD:  04/15/99 Job: [id number]

## 2010-11-08 ENCOUNTER — Other Ambulatory Visit (HOSPITAL_COMMUNITY): Payer: Self-pay | Admitting: Family Medicine

## 2010-11-08 DIAGNOSIS — Z1231 Encounter for screening mammogram for malignant neoplasm of breast: Secondary | ICD-10-CM

## 2010-11-17 ENCOUNTER — Ambulatory Visit (HOSPITAL_COMMUNITY): Payer: BC Managed Care – PPO

## 2010-11-25 ENCOUNTER — Ambulatory Visit (HOSPITAL_COMMUNITY): Payer: BC Managed Care – PPO

## 2010-11-30 ENCOUNTER — Ambulatory Visit (HOSPITAL_COMMUNITY)
Admission: RE | Admit: 2010-11-30 | Discharge: 2010-11-30 | Disposition: A | Discharge status: Home / Self Care | Payer: BC Managed Care – PPO | Source: Ambulatory Visit | Attending: Family Medicine | Admitting: Family Medicine

## 2010-11-30 DIAGNOSIS — Z1231 Encounter for screening mammogram for malignant neoplasm of breast: Secondary | ICD-10-CM

## 2011-02-16 LAB — ABO/RH: ABO/RH(D): AB POS

## 2011-02-16 LAB — COMPREHENSIVE METABOLIC PANEL
ALT: 25
AST: 57 — ABNORMAL HIGH
Alkaline Phosphatase: 86
CO2: 27
GFR calc Af Amer: >60
Glucose, Bld: 114 — ABNORMAL HIGH
Potassium: 5.6 — ABNORMAL HIGH
Sodium: 133 — ABNORMAL LOW
Total Protein: 6.5

## 2011-02-16 LAB — CBC
HCT: 20.3 — ABNORMAL LOW
HCT: 26.6 — ABNORMAL LOW
Hemoglobin: 10.1 — ABNORMAL LOW
Hemoglobin: 9.3 — ABNORMAL LOW
Hemoglobin: 12.1
MCHC: 34.2
MCHC: 34.7
MCHC: 35.1
MCHC: 36.5 — ABNORMAL HIGH
MCV: 91.1
MCV: 94.4
Platelets: 171
Platelets: 176
Platelets: 201
RBC: 3.21 — ABNORMAL LOW
RBC: 4.12
RBC: 3.86 — ABNORMAL LOW
RDW: 12.4
RDW: 15.3
RDW: 15.7 — ABNORMAL HIGH
RDW: 14.9
WBC: 8.6
WBC: 9.8

## 2011-02-16 LAB — DIFFERENTIAL
Basophils Absolute: 0
Basophils Relative: 0
Basophils Relative: 1
Eosinophils Absolute: 0.1
Eosinophils Relative: 1
Lymphs Abs: 1.5
Monocytes Relative: 8
Neutro Abs: 3.6
Neutrophils Relative %: 62
Neutrophils Relative %: 70

## 2011-02-16 LAB — BASIC METABOLIC PANEL
BUN: 5 — ABNORMAL LOW
BUN: 5 — ABNORMAL LOW
CO2: 27
Calcium: 7.7 — ABNORMAL LOW
Calcium: 7.8 — ABNORMAL LOW
Chloride: 102
Chloride: 105
Creatinine, Ser: 0.57
Creatinine, Ser: 0.92
GFR calc non Af Amer: >60
GFR calc non Af Amer: >60
Glucose, Bld: 131 — ABNORMAL HIGH
Glucose, Bld: 92
Potassium: 3.4 — ABNORMAL LOW
Sodium: 137

## 2011-02-16 LAB — URINALYSIS, ROUTINE W REFLEX MICROSCOPIC
Bilirubin Urine: NEGATIVE
Glucose, UA: NEGATIVE
Hgb urine dipstick: NEGATIVE
Ketones, ur: 15 — AB
Protein, ur: NEGATIVE
pH: 6.5

## 2011-02-16 LAB — TYPE AND SCREEN
ABO/RH(D): AB POS
Antibody Screen: NEGATIVE

## 2012-01-02 ENCOUNTER — Other Ambulatory Visit (HOSPITAL_COMMUNITY): Payer: Self-pay | Admitting: Family Medicine

## 2012-01-02 DIAGNOSIS — Z1231 Encounter for screening mammogram for malignant neoplasm of breast: Secondary | ICD-10-CM

## 2012-01-19 ENCOUNTER — Ambulatory Visit (HOSPITAL_COMMUNITY): Payer: BC Managed Care – PPO

## 2012-02-02 ENCOUNTER — Ambulatory Visit (HOSPITAL_COMMUNITY)
Admission: RE | Admit: 2012-02-02 | Discharge: 2012-02-02 | Disposition: A | Discharge status: Home / Self Care | Payer: BC Managed Care – PPO | Source: Ambulatory Visit | Attending: Family Medicine | Admitting: Family Medicine

## 2012-02-02 DIAGNOSIS — Z1231 Encounter for screening mammogram for malignant neoplasm of breast: Secondary | ICD-10-CM

## 2013-03-28 ENCOUNTER — Other Ambulatory Visit (HOSPITAL_COMMUNITY): Payer: Self-pay | Admitting: Family Medicine

## 2013-03-28 DIAGNOSIS — Z1231 Encounter for screening mammogram for malignant neoplasm of breast: Secondary | ICD-10-CM

## 2013-04-03 ENCOUNTER — Ambulatory Visit (HOSPITAL_COMMUNITY): Payer: BC Managed Care – PPO

## 2013-04-21 ENCOUNTER — Ambulatory Visit (HOSPITAL_COMMUNITY): Admission: RE | Admit: 2013-04-21 | Payer: BC Managed Care – PPO | Source: Ambulatory Visit

## 2013-05-01 ENCOUNTER — Ambulatory Visit (HOSPITAL_COMMUNITY): Payer: BC Managed Care – PPO

## 2013-05-21 ENCOUNTER — Ambulatory Visit (HOSPITAL_COMMUNITY): Payer: BC Managed Care – PPO

## 2013-05-30 ENCOUNTER — Ambulatory Visit (HOSPITAL_COMMUNITY)
Admission: RE | Admit: 2013-05-30 | Discharge: 2013-05-30 | Disposition: A | Discharge status: Home / Self Care | Payer: BC Managed Care – PPO | Source: Ambulatory Visit | Attending: Family Medicine | Admitting: Family Medicine

## 2013-05-30 DIAGNOSIS — Z1231 Encounter for screening mammogram for malignant neoplasm of breast: Secondary | ICD-10-CM | POA: Insufficient documentation

## 2014-07-09 ENCOUNTER — Emergency Department (HOSPITAL_COMMUNITY)
Admission: EM | Admit: 2014-07-09 | Discharge: 2014-07-09 | Disposition: A | Discharge status: Home / Self Care | Payer: BLUE CROSS/BLUE SHIELD | Attending: Emergency Medicine | Admitting: Emergency Medicine

## 2014-07-09 ENCOUNTER — Encounter (HOSPITAL_COMMUNITY): Payer: Self-pay

## 2014-07-09 DIAGNOSIS — Z792 Long term (current) use of antibiotics: Secondary | ICD-10-CM | POA: Diagnosis not present

## 2014-07-09 DIAGNOSIS — I10 Essential (primary) hypertension: Secondary | ICD-10-CM | POA: Insufficient documentation

## 2014-07-09 DIAGNOSIS — Z8639 Personal history of other endocrine, nutritional and metabolic disease: Secondary | ICD-10-CM | POA: Diagnosis not present

## 2014-07-09 DIAGNOSIS — R301 Vesical tenesmus: Secondary | ICD-10-CM | POA: Diagnosis not present

## 2014-07-09 DIAGNOSIS — N390 Urinary tract infection, site not specified: Secondary | ICD-10-CM | POA: Insufficient documentation

## 2014-07-09 DIAGNOSIS — Z793 Long term (current) use of hormonal contraceptives: Secondary | ICD-10-CM | POA: Diagnosis not present

## 2014-07-09 DIAGNOSIS — R339 Retention of urine, unspecified: Secondary | ICD-10-CM | POA: Diagnosis present

## 2014-07-09 HISTORY — DX: Essential (primary) hypertension: I10

## 2014-07-09 LAB — URINALYSIS, ROUTINE W REFLEX MICROSCOPIC
Bilirubin Urine: NEGATIVE
GLUCOSE, UA: NEGATIVE mg/dL
Ketones, ur: NEGATIVE mg/dL
Nitrite: NEGATIVE
PH: 6 (ref 5.0–8.0)
Protein, ur: NEGATIVE mg/dL
Specific Gravity, Urine: 1.008 (ref 1.005–1.030)
UROBILINOGEN UA: 0.2 mg/dL (ref 0.0–1.0)

## 2014-07-09 LAB — URINE MICROSCOPIC-ADD ON

## 2014-07-09 MED ORDER — BELLADONNA-OPIUM 16.2-30 MG RE SUPP: 30.0000 mg | Freq: Three times a day (TID) | RECTAL | Status: DC | PRN

## 2014-07-09 MED ORDER — CIPROFLOXACIN HCL 500 MG PO TABS
500.0000 mg | ORAL_TABLET | Freq: Once | ORAL | Status: AC
  Administered 2014-07-09: 500 mg via ORAL
  Filled 2014-07-09: qty 1

## 2014-07-09 MED ORDER — FLAVOXATE HCL 100 MG PO TABS: 100.0000 mg | ORAL_TABLET | Freq: Three times a day (TID) | ORAL | Status: DC | PRN

## 2014-07-09 MED ORDER — FLAVOXATE HCL 100 MG PO TABS
100.0000 mg | ORAL_TABLET | Freq: Three times a day (TID) | ORAL | Status: DC | PRN
  Administered 2014-07-09: 100 mg via ORAL
  Filled 2014-07-09: qty 1

## 2014-07-09 MED ORDER — CIPROFLOXACIN HCL 500 MG PO TABS: 500.0000 mg | ORAL_TABLET | Freq: Two times a day (BID) | ORAL | Status: DC

## 2014-07-09 NOTE — ED Provider Notes (Signed)
CSN: [id number]     Arrival date & time 07/09/14  0054 History   First MD Initiated Contact with Patient 07/09/14 0101     Chief Complaint  Patient presents with  . Urinary Retention     (Consider location/radiation/quality/duration/timing/severity/associated sxs/prior Treatment) HPI 59 year old female presents to emergency department with complaint of urinary frequency and suprapubic pain.  3 weeks ago, patient was diagnosed with a urinary tract infection.  She was placed on unknown antibiotic at that time.  A week ago she had return of symptoms and was placed on Augmentin by her primary care Dr. for 10 days.  Patient was doing well until a few days ago when the symptoms returned again.  Primary care doctor sent her to urology.  She was seen yesterday.  She had a urine sample that showed "pus" and was sent for culture.  Patient has appointment to follow back up with urology for further evaluation.  Once urinary tract infection is cleared.  She has not yet been started on antibiotics.  Urologist started her on uribel to help with bladder spasms.  She reports that she has been taking the medication, but it is not helping.  She has had 2 separate episodes where she is had severe cramping in her lower abdomen and urinates small amounts over the course of an hour.  She had another episode of bladder spasms just prior to arrival, which caused her to come to the ER.  She feels that she is not emptying her bladder completely, only urinating little dribbles at a time.  She denies any back pain, no fever.  No prior history of urinary issues. Past Medical History  Diagnosis Date  . Hypertension   . Thyroid disease    History reviewed. No pertinent past surgical history. History reviewed. No pertinent family history. History  Substance Use Topics  . Smoking status: Never Smoker   . Smokeless tobacco: Not on file  . Alcohol Use: No   OB History    No data available     Review of Systems   See  History of Present Illness; otherwise all other systems are reviewed and negative  Allergies  Codeine  Home Medications   Prior to Admission medications   Medication Sig Start Date End Date Taking? Authorizing Provider  belladonna-opium (B&O SUPPRETTES) 16.2-30 MG suppository Place 1 suppository rectally every 8 (eight) hours as needed for pain. 07/09/14   Kalman Drape, MD  ciprofloxacin (CIPRO) 500 MG tablet Take 1 tablet (500 mg total) by mouth 2 (two) times daily. 07/09/14   Kalman Drape, MD  CRESTOR 40 MG tablet Take 40 mg by mouth daily. 06/17/14   Historical Provider, MD  estradiol (ESTRACE) 2 MG tablet Take 0.5 tablets by mouth daily. 06/09/14   Historical Provider, MD  flavoxATE (URISPAS) 100 MG tablet Take 1 tablet (100 mg total) by mouth 3 (three) times daily as needed for bladder spasms. 07/09/14   Kalman Drape, MD  lisinopril (PRINIVIL,ZESTRIL) 5 MG tablet Take 1 tablet by mouth daily. 06/17/14   Historical Provider, MD  naproxen (NAPROSYN) 500 MG tablet Take 1 tablet by mouth 2 (two) times daily. 06/24/14   Historical Provider, MD  SYNTHROID 150 MCG tablet Take 1 tablet by mouth daily. 06/30/14   Historical Provider, MD  venlafaxine XR (EFFEXOR-XR) 37.5 MG 24 hr capsule Take 1 capsule by mouth daily. 07/02/14   Historical Provider, MD   BP 147/59 mmHg  Pulse 80  Temp(Src) 98 F (36.7 C) (  Oral)  Resp 18  SpO2 98% Physical Exam  Constitutional: She is oriented to person, place, and time. She appears well-developed and well-nourished.  HENT:  Head: Normocephalic and atraumatic.  Nose: Nose normal.  Mouth/Throat: Oropharynx is clear and moist.  Eyes: Conjunctivae and EOM are normal. Pupils are equal, round, and reactive to light.  Neck: Normal range of motion. Neck supple. No JVD present. No tracheal deviation present. No thyromegaly present.  Cardiovascular: Normal rate, regular rhythm, normal heart sounds and intact distal pulses.  Exam reveals no gallop and no friction rub.   No  murmur heard. Pulmonary/Chest: Effort normal and breath sounds normal. No stridor. No respiratory distress. She has no wheezes. She has no rales. She exhibits no tenderness.  Abdominal: Soft. Bowel sounds are normal. She exhibits no distension and no mass. There is tenderness (patient is tender with palpation in the suprapubic region.  No rebound or guarding.  No CVA tenderness). There is no rebound and no guarding.  Musculoskeletal: Normal range of motion. She exhibits no edema or tenderness.  Lymphadenopathy:    She has no cervical adenopathy.  Neurological: She is alert and oriented to person, place, and time. She displays normal reflexes. She exhibits normal muscle tone. Coordination normal.  Skin: Skin is warm and dry. No rash noted. No erythema. No pallor.  Psychiatric: She has a normal mood and affect. Her behavior is normal. Judgment and thought content normal.  Nursing note and vitals reviewed.   ED Course  Procedures (including critical care time) Labs Review Labs Reviewed  URINALYSIS, ROUTINE W REFLEX MICROSCOPIC - Abnormal; Notable for the following:    APPearance CLOUDY (*)    Hgb urine dipstick LARGE (*)    Leukocytes, UA LARGE (*)    All other components within normal limits  URINE MICROSCOPIC-ADD ON - Abnormal; Notable for the following:    Bacteria, UA MANY (*)    All other components within normal limits  URINE CULTURE    Imaging Review No results found.   EKG Interpretation None      MDM   Final diagnoses:  UTI (lower urinary tract infection)  Painful bladder spasm    59 year old female with dysuria, urinary tract infection.  Bladder scan showed approximate 500 cc, but catheter with 200.  Return.  I do not feel patient has urinary retention, more likely she is having bladder spasms secondary to infection.  Patient does not wish to go home with a urinary catheter.  Plan to send for culture, treat with ciprofloxacin, add on Urispas and DC uribel  Will also  prescribe her B&O suppositories to help with acute issues.  Patient is to follow up with her urologist    Kalman Drape, MD 07/09/14 [phone]

## 2014-07-09 NOTE — ED Notes (Signed)
Irrigated foley with 120cc of sterile water, only 50cc more returned

## 2014-07-09 NOTE — Discharge Instructions (Signed)
Contact your urologist in the morning for close follow-up.  Take medications as prescribed.  Return to the emergency department for worsening condition or new concerning symptoms.   Urinary Tract Infection Urinary tract infections (UTIs) can develop anywhere along your urinary tract. Your urinary tract is your body's drainage system for removing wastes and extra water. Your urinary tract includes two kidneys, two ureters, a bladder, and a urethra. Your kidneys are a pair of bean-shaped organs. Each kidney is about the size of your fist. They are located below your ribs, one on each side of your spine. CAUSES Infections are caused by microbes, which are microscopic organisms, including fungi, viruses, and bacteria. These organisms are so small that they can only be seen through a microscope. Bacteria are the microbes that most commonly cause UTIs. SYMPTOMS  Symptoms of UTIs may vary by age and gender of the patient and by the location of the infection. Symptoms in young women typically include a frequent and intense urge to urinate and a painful, burning feeling in the bladder or urethra during urination. Older women and men are more likely to be tired, shaky, and weak and have muscle aches and abdominal pain. A fever may mean the infection is in your kidneys. Other symptoms of a kidney infection include pain in your back or sides below the ribs, nausea, and vomiting. DIAGNOSIS To diagnose a UTI, your caregiver will ask you about your symptoms. Your caregiver also will ask to provide a urine sample. The urine sample will be tested for bacteria and white blood cells. White blood cells are made by your body to help fight infection. TREATMENT  Typically, UTIs can be treated with medication. Because most UTIs are caused by a bacterial infection, they usually can be treated with the use of antibiotics. The choice of antibiotic and length of treatment depend on your symptoms and the type of bacteria causing  your infection. HOME CARE INSTRUCTIONS  If you were prescribed antibiotics, take them exactly as your caregiver instructs you. Finish the medication even if you feel better after you have only taken some of the medication.  Drink enough water and fluids to keep your urine clear or pale yellow.  Avoid caffeine, tea, and carbonated beverages. They tend to irritate your bladder.  Empty your bladder often. Avoid holding urine for long periods of time.  Empty your bladder before and after sexual intercourse.  After a bowel movement, women should cleanse from front to back. Use each tissue only once. SEEK MEDICAL CARE IF:   You have back pain.  You develop a fever.  Your symptoms do not begin to resolve within 3 days. SEEK IMMEDIATE MEDICAL CARE IF:   You have severe back pain or lower abdominal pain.  You develop chills.  You have nausea or vomiting.  You have continued burning or discomfort with urination. MAKE SURE YOU:   Understand these instructions.  Will watch your condition.  Will get help right away if you are not doing well or get worse. Document Released: 02/15/2005 Document Revised: 11/07/2011 Document Reviewed: 06/16/2011 [facility] Patient Information 2015 [place], [place]. This information is not intended to replace advice given to you by your health care provider. Make sure you discuss any questions you have with your health care provider.

## 2014-07-09 NOTE — ED Notes (Signed)
Pt has had urinary tract infections and was seen by urology yesterday and urine had lots of pus in it, she was given Uribel by Dr Karsten Ro, pt states she hasn't voided all day just little dribbles here and there since 9am, she had a similar episode yesterday.

## 2014-07-12 LAB — URINE CULTURE: Colony Count: 15000

## 2014-07-13 ENCOUNTER — Telehealth (HOSPITAL_BASED_OUTPATIENT_CLINIC_OR_DEPARTMENT_OTHER): Payer: Self-pay | Admitting: Emergency Medicine

## 2014-07-13 NOTE — Progress Notes (Signed)
ED Antimicrobial Stewardship Positive Culture Follow Up   Michelle Kent is an 59 y.o. female who presented to [facility] on 07/09/2014 with a chief complaint of  Chief Complaint  Patient presents with  . Urinary Retention    Recent Results (from the past 720 hour(s))  Urine culture     Status: None   Collection Time: 07/09/14  1:26 AM  Result Value Ref Range Status   Specimen Description URINE, CATHETERIZED  Final   Special Requests NONE  Final   Colony Count   Final    15,000 COLONIES/ML Performed at [facility]    Culture   Final    ESCHERICHIA COLI Performed at [facility]    Report Status 07/12/2014 FINAL  Final   Organism ID, Bacteria ESCHERICHIA COLI  Final      Susceptibility   Escherichia coli - MIC*    AMPICILLIN >=32 RESISTANT Resistant     CEFAZOLIN >=64 RESISTANT Resistant     CEFTRIAXONE <=1 SENSITIVE Sensitive     CIPROFLOXACIN >=4 RESISTANT Resistant     GENTAMICIN <=1 SENSITIVE Sensitive     LEVOFLOXACIN >=8 RESISTANT Resistant     NITROFURANTOIN <=16 SENSITIVE Sensitive     TOBRAMYCIN <=1 SENSITIVE Sensitive     TRIMETH/SULFA <=20 SENSITIVE Sensitive     PIP/TAZO 8 SENSITIVE Sensitive     * ESCHERICHIA COLI    [x]  Treated with cipro, organism resistant to prescribed antimicrobial []  Patient discharged originally without antimicrobial agent and treatment is now indicated  New antibiotic prescription: Bactrim DS 1 tablet BID x 7 days  ED Provider: Domenic Moras, PA-C   Candie Mile 07/13/2014, 9:03 AM Infectious Diseases Pharmacist Phone# [phone]

## 2014-07-13 NOTE — Telephone Encounter (Signed)
Post ED Visit - Positive Culture Follow-up: Successful Patient Follow-Up  Culture assessed and recommendations reviewed by: []  Wes Dulaney, Pharm.D., BCPS [x]  Heide Guile, Pharm.D., BCPS []  Alycia Rossetti, Pharm.D., BCPS []  Fraser, Pharm.D., BCPS, AAHIVP []  Legrand Como, Pharm.D., BCPS, AAHIVP []  Hassie Bruce, Pharm.D. []  Milus Glazier, [place].D.  Positive urine  Culture E. Coli  []  Patient discharged without antimicrobial prescription and treatment is now indicated [x]  Organism is resistant to prescribed ED discharge antimicrobial []  Patient with positive blood cultures  Changes discussed with ED provider: Domenic Moras PA New antibiotic prescription d/c cipro and continue Nitrofurantoin as prescribed by urologist on 07/10/14 Called to n/a  Contacted patient, 07/13/14 1121   Hazle Nordmann 07/13/2014, 11:18 AM

## 2014-07-30 ENCOUNTER — Other Ambulatory Visit (HOSPITAL_COMMUNITY): Payer: Self-pay | Admitting: Family Medicine

## 2014-07-30 DIAGNOSIS — Z1231 Encounter for screening mammogram for malignant neoplasm of breast: Secondary | ICD-10-CM

## 2014-07-31 ENCOUNTER — Ambulatory Visit (HOSPITAL_COMMUNITY): Payer: BLUE CROSS/BLUE SHIELD

## 2014-08-07 ENCOUNTER — Ambulatory Visit (HOSPITAL_COMMUNITY)
Admission: RE | Admit: 2014-08-07 | Discharge: 2014-08-07 | Disposition: A | Discharge status: Home / Self Care | Payer: BLUE CROSS/BLUE SHIELD | Source: Ambulatory Visit | Attending: Family Medicine | Admitting: Family Medicine

## 2014-08-07 DIAGNOSIS — Z1231 Encounter for screening mammogram for malignant neoplasm of breast: Secondary | ICD-10-CM | POA: Diagnosis not present

## 2016-02-08 ENCOUNTER — Other Ambulatory Visit: Payer: Self-pay | Admitting: Family Medicine

## 2016-02-08 DIAGNOSIS — Z1231 Encounter for screening mammogram for malignant neoplasm of breast: Secondary | ICD-10-CM

## 2016-02-17 ENCOUNTER — Ambulatory Visit: Payer: BLUE CROSS/BLUE SHIELD

## 2016-02-22 ENCOUNTER — Ambulatory Visit
Admission: RE | Admit: 2016-02-22 | Discharge: 2016-02-22 | Disposition: A | Discharge status: Home / Self Care | Payer: BLUE CROSS/BLUE SHIELD | Source: Ambulatory Visit | Attending: Family Medicine | Admitting: Family Medicine

## 2016-02-22 DIAGNOSIS — Z1231 Encounter for screening mammogram for malignant neoplasm of breast: Secondary | ICD-10-CM

## 2016-07-20 HISTORY — PX: BASAL CELL CARCINOMA EXCISION: SHX1214

## 2016-08-03 ENCOUNTER — Encounter (HOSPITAL_COMMUNITY): Payer: Self-pay

## 2016-08-03 ENCOUNTER — Observation Stay (HOSPITAL_COMMUNITY)
Admission: EM | Admit: 2016-08-03 | Discharge: 2016-08-04 | Disposition: A | Discharge status: Home / Self Care | Payer: BLUE CROSS/BLUE SHIELD | Attending: Internal Medicine | Admitting: Internal Medicine

## 2016-08-03 ENCOUNTER — Emergency Department (HOSPITAL_COMMUNITY): Payer: BLUE CROSS/BLUE SHIELD

## 2016-08-03 ENCOUNTER — Observation Stay (HOSPITAL_COMMUNITY): Payer: BLUE CROSS/BLUE SHIELD

## 2016-08-03 DIAGNOSIS — R0789 Other chest pain: Principal | ICD-10-CM

## 2016-08-03 DIAGNOSIS — I10 Essential (primary) hypertension: Secondary | ICD-10-CM

## 2016-08-03 DIAGNOSIS — R079 Chest pain, unspecified: Secondary | ICD-10-CM | POA: Diagnosis present

## 2016-08-03 DIAGNOSIS — R55 Syncope and collapse: Secondary | ICD-10-CM

## 2016-08-03 DIAGNOSIS — E039 Hypothyroidism, unspecified: Secondary | ICD-10-CM | POA: Diagnosis not present

## 2016-08-03 DIAGNOSIS — Z79899 Other long term (current) drug therapy: Secondary | ICD-10-CM | POA: Insufficient documentation

## 2016-08-03 DIAGNOSIS — J329 Chronic sinusitis, unspecified: Secondary | ICD-10-CM

## 2016-08-03 DIAGNOSIS — E785 Hyperlipidemia, unspecified: Secondary | ICD-10-CM | POA: Diagnosis present

## 2016-08-03 HISTORY — DX: Basal cell carcinoma of skin, unspecified: C44.91

## 2016-08-03 HISTORY — DX: Cardiac murmur, unspecified: R01.1

## 2016-08-03 HISTORY — DX: Syncope and collapse: R55

## 2016-08-03 HISTORY — DX: Hypothyroidism, unspecified: E03.9

## 2016-08-03 HISTORY — DX: Hyperlipidemia, unspecified: E78.5

## 2016-08-03 LAB — CBC
HCT: 41.4 % (ref 36.0–46.0)
HCT: 36.8 % (ref 36.0–46.0)
HEMOGLOBIN: 14.1 g/dL (ref 12.0–15.0)
HEMOGLOBIN: 12.7 g/dL (ref 12.0–15.0)
MCH: 30.9 pg (ref 26.0–34.0)
MCH: 31.1 pg (ref 26.0–34.0)
MCHC: 34.1 g/dL (ref 30.0–36.0)
MCHC: 34.5 g/dL (ref 30.0–36.0)
MCV: 90.6 fL (ref 78.0–100.0)
MCV: 90.2 fL (ref 78.0–100.0)
Platelets: 216 10*3/uL (ref 150–400)
Platelets: 186 10*3/uL (ref 150–400)
RBC: 4.57 MIL/uL (ref 3.87–5.11)
RBC: 4.08 MIL/uL (ref 3.87–5.11)
RDW: 12 % (ref 11.5–15.5)
RDW: 12 % (ref 11.5–15.5)
WBC: 6.4 10*3/uL (ref 4.0–10.5)
WBC: 5.3 10*3/uL (ref 4.0–10.5)

## 2016-08-03 LAB — BASIC METABOLIC PANEL
Anion gap: 10 (ref 5–15)
BUN: 12 mg/dL (ref 6–20)
CO2: 29 mmol/L (ref 22–32)
Calcium: 9.4 mg/dL (ref 8.9–10.3)
Chloride: 100 mmol/L — ABNORMAL LOW (ref 101–111)
Creatinine, Ser: 0.64 mg/dL (ref 0.44–1.00)
GFR calc Af Amer: >60 mL/min (ref >60)
GLUCOSE: 93 mg/dL (ref 65–99)
Potassium: 3.5 mmol/L (ref 3.5–5.1)
Sodium: 139 mmol/L (ref 135–145)

## 2016-08-03 LAB — URINALYSIS, ROUTINE W REFLEX MICROSCOPIC
Bilirubin Urine: NEGATIVE
Glucose, UA: NEGATIVE mg/dL
HGB URINE DIPSTICK: NEGATIVE
Ketones, ur: NEGATIVE mg/dL
Leukocytes, UA: NEGATIVE
Nitrite: NEGATIVE
Protein, ur: NEGATIVE mg/dL
SPECIFIC GRAVITY, URINE: 1.002 — AB (ref 1.005–1.030)
pH: 6 (ref 5.0–8.0)

## 2016-08-03 LAB — CREATININE, SERUM
Creatinine, Ser: 0.69 mg/dL (ref 0.44–1.00)
GFR calc Af Amer: >60 mL/min (ref >60)
GFR calc non Af Amer: >60 mL/min (ref >60)

## 2016-08-03 LAB — I-STAT TROPONIN, ED: Troponin i, poc: 0 ng/mL (ref 0.00–0.08)

## 2016-08-03 LAB — D-DIMER, QUANTITATIVE: D-Dimer, Quant: <0.27 ug/mL-FEU (ref 0.00–0.50)

## 2016-08-03 LAB — TROPONIN I: Troponin I: <0.03 ng/mL (ref <0.03)

## 2016-08-03 MED ORDER — ACETAMINOPHEN 325 MG PO TABS
650.0000 mg | ORAL_TABLET | Freq: Once | ORAL | Status: AC
  Administered 2016-08-03: 650 mg via ORAL
  Filled 2016-08-03: qty 2

## 2016-08-03 MED ORDER — LISINOPRIL 5 MG PO TABS
5.0000 mg | ORAL_TABLET | Freq: Every day | ORAL | Status: DC
  Administered 2016-08-04: 5 mg via ORAL
  Filled 2016-08-03: qty 1

## 2016-08-03 MED ORDER — LEVOTHYROXINE SODIUM 150 MCG PO TABS
150.0000 ug | ORAL_TABLET | Freq: Every day | ORAL | Status: DC
  Administered 2016-08-04: 150 ug via ORAL
  Filled 2016-08-03: qty 2
  Filled 2016-08-03: qty 1
  Filled 2016-08-03: qty 2

## 2016-08-03 MED ORDER — NITROGLYCERIN 0.4 MG SL SUBL
0.4000 mg | SUBLINGUAL_TABLET | SUBLINGUAL | Status: DC | PRN
  Administered 2016-08-03 (×2): 0.4 mg via SUBLINGUAL
  Filled 2016-08-03 (×3): qty 1

## 2016-08-03 MED ORDER — ROSUVASTATIN CALCIUM 40 MG PO TABS
40.0000 mg | ORAL_TABLET | Freq: Every day | ORAL | Status: DC
  Administered 2016-08-03: 40 mg via ORAL
  Filled 2016-08-03: qty 1
  Filled 2016-08-03 (×2): qty 4
  Filled 2016-08-03: qty 1

## 2016-08-03 MED ORDER — ACETAMINOPHEN 325 MG PO TABS
650.0000 mg | ORAL_TABLET | ORAL | Status: DC | PRN
  Administered 2016-08-04 (×2): 650 mg via ORAL
  Filled 2016-08-03 (×2): qty 2

## 2016-08-03 MED ORDER — ONDANSETRON HCL 4 MG/2ML IJ SOLN: 4.0000 mg | Freq: Four times a day (QID) | INTRAMUSCULAR | Status: DC | PRN

## 2016-08-03 MED ORDER — VENLAFAXINE HCL ER 37.5 MG PO CP24
37.5000 mg | ORAL_CAPSULE | Freq: Every day | ORAL | Status: DC
  Administered 2016-08-04: 37.5 mg via ORAL
  Filled 2016-08-03: qty 1

## 2016-08-03 MED ORDER — SALINE SPRAY 0.65 % NA SOLN
1.0000 | NASAL | Status: DC | PRN
  Filled 2016-08-03: qty 44

## 2016-08-03 MED ORDER — SODIUM CHLORIDE 0.9 % IV BOLUS (SEPSIS)
1000.0000 mL | Freq: Once | INTRAVENOUS | Status: AC
  Administered 2016-08-03: 1000 mL via INTRAVENOUS

## 2016-08-03 MED ORDER — AZITHROMYCIN 250 MG PO TABS
500.0000 mg | ORAL_TABLET | Freq: Every day | ORAL | Status: AC
  Administered 2016-08-03: 500 mg via ORAL
  Filled 2016-08-03: qty 2

## 2016-08-03 MED ORDER — AZITHROMYCIN 250 MG PO TABS: 250.0000 mg | ORAL_TABLET | ORAL | Status: DC

## 2016-08-03 MED ORDER — ENOXAPARIN SODIUM 40 MG/0.4ML ~~LOC~~ SOLN
40.0000 mg | SUBCUTANEOUS | Status: DC
  Administered 2016-08-03: 40 mg via SUBCUTANEOUS
  Filled 2016-08-03: qty 0.4

## 2016-08-03 NOTE — ED Notes (Signed)
Patient transported to X-ray 

## 2016-08-03 NOTE — ED Notes (Signed)
Pt complaining of head and chest pressure. Informed Rhae Lerner - RN.

## 2016-08-03 NOTE — ED Notes (Signed)
Gave pt Ginger Ale, per Kaleigh - RN. 

## 2016-08-03 NOTE — ED Provider Notes (Signed)
[facility] DEPT Provider Note   CSN: [id number] Arrival date & time: 08/03/16  1444     History   Chief Complaint No chief complaint on file.   HPI Hisae Decoursey is a 61 y.o. female.  HPI   This is a 60 year old female with history of hypertension hyperlipidemia presenting today with presyncopal episode. Patient was feeling very flushed/heaviness to her cest went to the bathroom and she felt weak kneed and was seated on a chair she had some heaviness in her head and some heaviness in her chest.  Heaviness did not go to jaw or either hand. No shortness breath no diaphoresis per patient. But on arrival nurse contacted MD because she appeared diaphoretici.   Patient spent last week not feeling very well, not moving around as much as usual. 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 Patient has noted no swelling to her bilateral lower extremities,  Not on estrogens. No long plane flights or car rides.  Past Medical History:  Diagnosis Date  . Hypertension   . Thyroid disease     There are no active problems to display for this patient.   History reviewed. No pertinent surgical history.  OB History    No data available       Home Medications    Prior to Admission medications   Medication Sig Start Date End Date Taking? Authorizing Provider  belladonna-opium (B&O SUPPRETTES) 16.2-30 MG suppository Place 1 suppository rectally every 8 (eight) hours as needed for pain. 07/09/14   Linton Flemings, MD  ciprofloxacin (CIPRO) 500 MG tablet Take 1 tablet (500 mg total) by mouth 2 (two) times daily. 07/09/14   Linton Flemings, MD  CRESTOR 40 MG tablet Take 40 mg by mouth daily. 06/17/14   Historical Provider, MD  estradiol (ESTRACE) 2 MG tablet Take 0.5 tablets by mouth daily. 06/09/14   Historical Provider, MD  flavoxATE (URISPAS) 100 MG tablet Take 1 tablet (100 mg total) by mouth 3 (three) times daily as needed for bladder spasms. 07/09/14   Linton Flemings, MD  lisinopril  (PRINIVIL,ZESTRIL) 5 MG tablet Take 1 tablet by mouth daily. 06/17/14   Historical Provider, MD  naproxen (NAPROSYN) 500 MG tablet Take 1 tablet by mouth 2 (two) times daily. 06/24/14   Historical Provider, MD  SYNTHROID 150 MCG tablet Take 1 tablet by mouth daily. 06/30/14   Historical Provider, MD  venlafaxine XR (EFFEXOR-XR) 37.5 MG 24 hr capsule Take 1 capsule by mouth daily. 07/02/14   Historical Provider, MD    Family History History reviewed. No pertinent family history.  Social History Social History  Substance Use Topics  . Smoking status: Never Smoker  . Smokeless tobacco: Never Used  . Alcohol use No     Allergies   Codeine   Review of Systems Review of Systems  Constitutional: Negative for activity change, fatigue and fever.  HENT: Negative for congestion.   Respiratory: Positive for chest tightness. Negative for shortness of breath.   Cardiovascular: Negative for leg swelling.  Gastrointestinal: Negative for abdominal pain.  Genitourinary: Negative for dysuria.  Neurological: Positive for light-headedness.     Physical Exam Updated Vital Signs BP 110/85   Pulse 81   Temp 98.4 F (36.9 C) (Oral)   Resp 17   Ht 5\' 2"  (1.575 m)   Wt 155 lb (70.3 kg)   SpO2 98%   BMI 28.35 kg/m   Physical Exam  Constitutional: She is oriented to person, place, and time. She  appears well-developed and well-nourished.  HENT:  Head: Normocephalic and atraumatic.  Eyes: Right eye exhibits no discharge.  Cardiovascular: Normal rate, regular rhythm and normal heart sounds.   No murmur heard. Pulmonary/Chest: Effort normal and breath sounds normal. She has no wheezes. She has no rales.  Well healing incision to R chest wall  Abdominal: Soft. She exhibits no distension. There is no tenderness.  Neurological: She is oriented to person, place, and time.  Skin: Skin is warm and dry. She is not diaphoretic.  Psychiatric: She has a normal mood and affect.  Nursing note and vitals  reviewed.    ED Treatments / Results  Labs (all labs ordered are listed, but only abnormal results are displayed) Labs Reviewed  BASIC METABOLIC PANEL - Abnormal; Notable for the following:       Result Value   Chloride 100 (*)    All other components within normal limits  URINALYSIS, ROUTINE W REFLEX MICROSCOPIC - Abnormal; Notable for the following:    Color, Urine COLORLESS (*)    Specific Gravity, Urine 1.002 (*)    All other components within normal limits  CBC  D-DIMER, QUANTITATIVE (NOT AT [facility])  I-STAT TROPOININ, ED  CBG MONITORING, ED    EKG  EKG Interpretation  Date/Time:  Thursday August 03 2016 14:40:20 EDT Ventricular Rate:  78 PR Interval:  174 QRS Duration: 80 QT Interval:  360 QTC Calculation: 410 R Axis:   41 Text Interpretation:  Normal sinus rhythm Possible Anterior infarct , age undetermined Abnormal ECG When compared with ECG of 11/08/2007, No significant change was found Confirmed by [facility]  MD, DAVID ([id number]) on 08/03/2016 3:56:03 PM       Radiology No results found.  Procedures Procedures (including critical care time)  Medications Ordered in ED Medications  sodium chloride 0.9 % bolus 1,000 mL (not administered)  nitroGLYCERIN (NITROSTAT) SL tablet 0.4 mg (not administered)     Initial Impression / Assessment and Plan / ED Course  I have reviewed the triage vital signs and the nursing notes.  Pertinent labs & imaging results that were available during my care of the patient were reviewed by me and considered in my medical decision making (see chart for details).     Patient is a 61 year old female with past medical history significant for hypertension and hypercholesterol. She is presenting today with near syncope, and chest and head heaviness. We will do CT head to rule out an Intracranial hemorrhage. We will do an EKG to look for cardiac ischemia. Patietn has, hypertension and hyperlipidemia as risk factors and constinued CP made better by  nitro.    D-dimer added because of recent surgery and mild immobilization for the last several days.  5:34 PM Patient still has continued chest pain. Story concerning with age, htn and hld and unknown fh (patient father unknown).  Will admit for CP rule out.       Final Clinical Impressions(s) / ED Diagnoses   Final diagnoses:  None    New Prescriptions New Prescriptions   No medications on file     Rip Hawes Julio Alm, MD 08/03/16 1752

## 2016-08-03 NOTE — ED Notes (Signed)
ED Provider at bedside. 

## 2016-08-03 NOTE — ED Triage Notes (Addendum)
Pt presents with sudden onset of frontal headache followed by weakness and near syncope.  Pt reports mid-sternal chest pressure, denies any shortness of breath. BP by EMS: 220/90; pt had basal cell removal x 1 week ago.

## 2016-08-03 NOTE — ED Notes (Signed)
Pt eating [employer], per family and pt, admitting provider communicated to pt she is allowed to eat and drink.

## 2016-08-03 NOTE — ED Notes (Signed)
Per pt, will see if ordered tylenol relieves her pain before taking a SL NTG.

## 2016-08-03 NOTE — H&P (Addendum)
History and Physical    [name] IRC:[id number] DOB: 1956/05/11 DOA: 08/03/2016  PCP: No PCP Per Patient  Patient coming from: Home  Chief Complaint: Chest pain, headache  HPI: Michelle Kent is a 61 y.o. female with medical history significant of non-smoker, HTN, HLD who presents to the ED with complaints of feeling flushed with chest pressure/heaviness on the day of admission. Denies sob. Reports feeling "under the weather" recently.  ED Course: In the ED, patient was given NTG with near resolution of chest pain. EKG was obtained with no ischemic findings appreciated. Initial troponin was noted to be neg. Head CT was obtained with findings of opacification of the R sphenoid sinus. Given continued chest pains, hospitalist was consulted with consideration for admission for chest pain work up.  Review of Systems:  Review of Systems  Constitutional: Negative for diaphoresis, malaise/fatigue and weight loss.  HENT: Negative for ear pain and tinnitus.   Eyes: Negative for double vision, photophobia and pain.  Respiratory: Negative for sputum production, shortness of breath, wheezing and stridor.   Cardiovascular: Positive for chest pain. Negative for palpitations, orthopnea and leg swelling.  Gastrointestinal: Negative for constipation, nausea and vomiting.  Genitourinary: Negative for frequency and urgency.  Musculoskeletal: Negative for back pain, joint pain and neck pain.  Neurological: Negative for tingling, tremors, sensory change, speech change and seizures.  Psychiatric/Behavioral: Negative for hallucinations and memory loss.    Past Medical History:  Diagnosis Date  . Hypertension   . Thyroid disease     History reviewed. No pertinent surgical history.   reports that she has never smoked. She has never used smokeless tobacco. She reports that she does not drink alcohol. Her drug history is not on file.  Allergies  Allergen Reactions  . Codeine Nausea And Vomiting    Family  history: Lung, breast, cervical cancers  Prior to Admission medications   Medication Sig Start Date End Date Taking? Authorizing Provider  belladonna-opium (B&O SUPPRETTES) 16.2-30 MG suppository Place 1 suppository rectally every 8 (eight) hours as needed for pain. 07/09/14   Linton Flemings, MD  ciprofloxacin (CIPRO) 500 MG tablet Take 1 tablet (500 mg total) by mouth 2 (two) times daily. 07/09/14   Linton Flemings, MD  CRESTOR 40 MG tablet Take 40 mg by mouth daily. 06/17/14   Historical Provider, MD  estradiol (ESTRACE) 2 MG tablet Take 0.5 tablets by mouth daily. 06/09/14   Historical Provider, MD  flavoxATE (URISPAS) 100 MG tablet Take 1 tablet (100 mg total) by mouth 3 (three) times daily as needed for bladder spasms. 07/09/14   Linton Flemings, MD  lisinopril (PRINIVIL,ZESTRIL) 5 MG tablet Take 1 tablet by mouth daily. 06/17/14   Historical Provider, MD  naproxen (NAPROSYN) 500 MG tablet Take 1 tablet by mouth 2 (two) times daily. 06/24/14   Historical Provider, MD  SYNTHROID 150 MCG tablet Take 1 tablet by mouth daily. 06/30/14   Historical Provider, MD  venlafaxine XR (EFFEXOR-XR) 37.5 MG 24 hr capsule Take 1 capsule by mouth daily. 07/02/14   Historical Provider, MD    Physical Exam: Vitals:   08/03/16 1601 08/03/16 1630 08/03/16 1645 08/03/16 1730  BP: 110/85 (!) 143/66 (!) 165/75 126/63  Pulse: 81 75 86 76  Resp: 17 13 14 12   Temp:      TempSrc:      SpO2: 98% 98% 98% 98%  Weight:      Height:        Constitutional: NAD, calm, comfortable Vitals:   08/03/16  1601 08/03/16 1630 08/03/16 1645 08/03/16 1730  BP: 110/85 (!) 143/66 (!) 165/75 126/63  Pulse: 81 75 86 76  Resp: 17 13 14 12   Temp:      TempSrc:      SpO2: 98% 98% 98% 98%  Weight:      Height:       Eyes: PERRL, lids and conjunctivae normal ENMT: Mucous membranes are moist. Posterior pharynx clear of any exudate or lesions.Normal dentition.  Neck: normal, supple, no masses, no thyromegaly Respiratory: clear to auscultation  bilaterally, no wheezing, no crackles. Normal respiratory effort. No accessory muscle use.  Cardiovascular: Regular rate and rhythm, no murmurs / rubs / gallops. No extremity edema. 2+ pedal pulses. No carotid bruits.  Abdomen: no tenderness, no masses palpated. No hepatosplenomegaly. Bowel sounds positive.  Musculoskeletal: no clubbing / cyanosis. No joint deformity upper and lower extremities. Good ROM, no contractures. Normal muscle tone.  Skin: no rashes, lesions, ulcers. No induration Neurologic: CN 2-12 grossly intact. Sensation intact, DTR normal. Strength 5/5 in all 4.  Psychiatric: Normal judgment and insight. Alert and oriented x 3. Normal mood.    Labs on Admission: I have personally reviewed following labs and imaging studies  CBC:  Recent Labs Lab 08/03/16 1430  WBC 6.4  HGB 14.1  HCT 41.4  MCV 90.6  PLT 130   Basic Metabolic Panel:  Recent Labs Lab 08/03/16 1430  NA 139  K 3.5  CL 100*  CO2 29  GLUCOSE 93  BUN 12  CREATININE 0.64  CALCIUM 9.4   GFR: Estimated Creatinine Clearance: 67.8 mL/min (by C-G formula based on SCr of 0.64 mg/dL). Liver Function Tests: No results for input(s): AST, ALT, ALKPHOS, BILITOT, PROT, ALBUMIN in the last 168 hours. No results for input(s): LIPASE, AMYLASE in the last 168 hours. No results for input(s): AMMONIA in the last 168 hours. Coagulation Profile: No results for input(s): INR, PROTIME in the last 168 hours. Cardiac Enzymes: No results for input(s): CKTOTAL, CKMB, CKMBINDEX, TROPONINI in the last 168 hours. BNP (last 3 results) No results for input(s): PROBNP in the last 8760 hours. HbA1C: No results for input(s): HGBA1C in the last 72 hours. CBG: No results for input(s): GLUCAP in the last 168 hours. Lipid Profile: No results for input(s): CHOL, HDL, LDLCALC, TRIG, CHOLHDL, LDLDIRECT in the last 72 hours. Thyroid Function Tests: No results for input(s): TSH, T4TOTAL, FREET4, T3FREE, THYROIDAB in the last 72  hours. Anemia Panel: No results for input(s): VITAMINB12, FOLATE, FERRITIN, TIBC, IRON, RETICCTPCT in the last 72 hours. Urine analysis:    Component Value Date/Time   COLORURINE COLORLESS (A) 08/03/2016 1452   APPEARANCEUR CLEAR 08/03/2016 1452   LABSPEC 1.002 (L) 08/03/2016 1452   PHURINE 6.0 08/03/2016 1452   GLUCOSEU NEGATIVE 08/03/2016 1452   HGBUR NEGATIVE 08/03/2016 1452   BILIRUBINUR NEGATIVE 08/03/2016 1452   KETONESUR NEGATIVE 08/03/2016 1452   PROTEINUR NEGATIVE 08/03/2016 1452   UROBILINOGEN 0.2 07/09/2014 0126   NITRITE NEGATIVE 08/03/2016 1452   LEUKOCYTESUR NEGATIVE 08/03/2016 1452   Sepsis Labs: !!!!!!!!!!!!!!!!!!!!!!!!!!!!!!!!!!!!!!!!!!!! @LABRCNTIP (procalcitonin:4,lacticidven:4) )No results found for this or any previous visit (from the past 240 hour(s)).   Radiological Exams on Admission: Ct Head Wo Contrast  Result Date: 08/03/2016 CLINICAL DATA:  Fall.  Frontal headache, weakness and near syncope. EXAM: CT HEAD WITHOUT CONTRAST TECHNIQUE: Contiguous axial images were obtained from the base of the skull through the vertex without intravenous contrast. COMPARISON:  None FINDINGS: Brain: No evidence of acute infarction, hemorrhage, hydrocephalus, extra-axial  collection or mass lesion/mass effect. Vascular: No hyperdense vessel or unexpected calcification. Skull: Normal. Negative for fracture or focal lesion. Sinuses/Orbits: Opacification of the right side of sphenoid sinus noted. The mastoid air cells are clear the calvarium appears intact. Other: None IMPRESSION: 1. No acute intracranial abnormalities. 2. Sphenoid sinus opacification. Electronically Signed   By: Kerby Moors M.D.   On: 08/03/2016 17:30    EKG: Independently reviewed. NSR  Assessment/Plan Principal Problem:   Chest pain Active Problems:   Sinusitis   HTN (hypertension)  1. Chest pain 1. HEART score of 3 2. Patient with continued chest pain improved with NTG 3. EKG without ischemic changes,  reviewed. Trop neg x 1 thus far 4. Will place in observation, tele 5. Follow serial troponin 6. Request 2d echo 7. Keep NPO should stress test be warranted 8. Check a1c 9. Cont NTG as needed 2. Sinusitis 1. Noted on head CT 2. Question if related to presenting headache 3. Will continue on azithromycin 3. HTN 1. BP currently stable 2. Cont home regimen as toelrated 4. HLD 1. Continue on statin 2. Will check AM lipids 5. Hypothyroid 1. Will continue thyroid replacement 2. Check TSH in AM  DVT prophylaxis: Lovenox subQ  Code Status: Full Family Communication: Pt in room  Disposition Plan: Uncertain at this time  Consults called:  Admission status: Observation status, as it will likely require less than 2 midnight stay to work up chest pain   CHIU, Orpah Melter MD [facility] Pager [phone]  If 7PM-7AM, please contact night-coverage [email or web address] Password [facility]  08/03/2016, 6:04 PM

## 2016-08-04 ENCOUNTER — Encounter (HOSPITAL_COMMUNITY): Payer: Self-pay | Admitting: Physician Assistant

## 2016-08-04 ENCOUNTER — Other Ambulatory Visit (HOSPITAL_COMMUNITY): Payer: BLUE CROSS/BLUE SHIELD

## 2016-08-04 ENCOUNTER — Other Ambulatory Visit: Payer: Self-pay | Admitting: Physician Assistant

## 2016-08-04 DIAGNOSIS — R072 Precordial pain: Secondary | ICD-10-CM | POA: Diagnosis not present

## 2016-08-04 DIAGNOSIS — E785 Hyperlipidemia, unspecified: Secondary | ICD-10-CM | POA: Diagnosis present

## 2016-08-04 DIAGNOSIS — R079 Chest pain, unspecified: Secondary | ICD-10-CM

## 2016-08-04 DIAGNOSIS — E039 Hypothyroidism, unspecified: Secondary | ICD-10-CM | POA: Diagnosis present

## 2016-08-04 DIAGNOSIS — E782 Mixed hyperlipidemia: Secondary | ICD-10-CM | POA: Diagnosis not present

## 2016-08-04 DIAGNOSIS — I10 Essential (primary) hypertension: Secondary | ICD-10-CM | POA: Diagnosis not present

## 2016-08-04 DIAGNOSIS — E0580 Other thyrotoxicosis without thyrotoxic crisis or storm: Secondary | ICD-10-CM

## 2016-08-04 LAB — LIPID PANEL
Cholesterol: 104 mg/dL (ref 0–200)
HDL: 49 mg/dL (ref >40)
LDL CALC: 28 mg/dL (ref 0–99)
TRIGLYCERIDES: 134 mg/dL (ref <150)
Total CHOL/HDL Ratio: 2.1 RATIO
VLDL: 27 mg/dL (ref 0–40)

## 2016-08-04 LAB — HIV ANTIBODY (ROUTINE TESTING W REFLEX): HIV SCREEN 4TH GENERATION: NONREACTIVE

## 2016-08-04 LAB — TSH: TSH: 0.102 u[IU]/mL — AB (ref 0.350–4.500)

## 2016-08-04 LAB — TROPONIN I: Troponin I: <0.03 ng/mL (ref <0.03)

## 2016-08-04 LAB — T4, FREE: Free T4: 0.99 ng/dL (ref 0.61–1.12)

## 2016-08-04 NOTE — Discharge Instructions (Signed)
° °  You have a Stress Test scheduled at [place] [facility]. Your doctor has ordered this test to check the blood flow in your heart arteries.  Please arrive 15 minutes early for paperwork. The whole test will take several hours. You may want to bring reading material to remain occupied while undergoing different parts of the test.  Instructions:  No food/drink after midnight the night before.  It is OK to take your morning meds with a sip of water EXCEPT for those types of medicines listed below or otherwise instructed.  No caffeine/decaf products 24 hours before, including medicines such as Excedrin or Goody Powders. Call if there are any questions.   Wear comfortable clothes and shoes.   Special Medication Instructions:  Remove nitroglycerin patches and do not take nitrate preparations such as Imdur/isosorbide the day of your test.  No Persantine/Theophylline or Aggrenox medicines should be used within 24 hours of the test.   If you are diabetic, please ask which medications to hold the day of the test.  What To Expect: When you arrive in the lab, the technician will inject a small amount of radioactive tracer into your arm through an IV while you are resting quietly. This helps [place] to form pictures of your heart. You will likely only feel a sting from the IV. After a waiting period, resting pictures will be obtained under a big camera. These are the "before" pictures.  Next, you will be prepped for the stress portion of the test. This may include either walking on a treadmill or receiving a medicine that helps to dilate blood vessels in your heart to simulate the effect of exercise on your heart. If you are walking on a treadmill, you will walk at different paces to try to get your heart rate to a goal number that is based on your age. If your doctor has chosen the pharmacologic test, then you will receive a medicine through your IV that may cause temporary nausea,  flushing, shortness of breath and sometimes chest discomfort or vomiting. This is typically short-lived and usually resolves quickly. If you experience symptoms, that does not automatically mean the test is abnormal. Some patients do not experience any symptoms at all. Your blood pressure and heart rate will be monitored, and we will be watching your EKG on a computer screen for any changes. During this portion of the test, the radiologist will inject another small amount of radioactive tracer into your IV. After a waiting period, you will undergo a second set of pictures. These are the "after" pictures.  The doctor reading the test will compare the before-and-after images to look for evidence of heart blockages or heart weakness. The test usually takes 1 day to complete, but in certain instances (for example, if a patient is over a certain weight limit), the test may be done over the span of 2 days.

## 2016-08-04 NOTE — Consult Note (Signed)
CARDIOLOGY CONSULT NOTE   Patient ID: Michelle Kent MRN: [id number] DOB/AGE: 62/26/1957 62 y.o.  Admit date: 08/03/2016  Requesting Physician: Dr. Maylene Roes  Primary Physician:   Michelle Naas, MD Primary Cardiologist: New Reason for Consultation:   Chest pain  HPI: Michelle Kent is a 61 y.o. female with a history of HTN, HLD, hypothyroidism and no past cardiac history who presented to [facility] ED on 08/03/16 for evaluation of chest pressure, weakness/presyncope and HA.  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 She does not formally exercise but denies a history of exertional chest pain or dyspnea. She is a never smoker and does not drink or use illicit drugs. She follows with her regular medical doctor regularly and takes medicine for hypertension, hyperlipidemia and hypothyroidism. She has no known family history of CAD, but she does not know her father or his family history.  She was in her usual state of health until this week when she had a basal cell carcinoma removed from her chest. Per family members she was a little less active than usual. She finally went back to work yesterday and was standing behind the register when she had sudden onset of weakness in her legs, frontal head pressure and dry mouth. She felt like she was going to pass out so she went to the bathroom to splash water on her face. Her coworkers Pharmacist, community. When EMS arrived her blood pressure was found to be 220/90. She then developed chest pressure/heaviness as if a book was being laid up on her chest. This was associated with shortness of breath and nausea. This eased off with one SL nitroglycerin and she was brought to the [facility] emergency department.  In the emergency department, EKG with no acute ST or T-wave changes. CT head with no acutes but some findings of opacification of the R sphenoid sinus. Troponin negative. D-dimer negative.  No lower extremity edema, orthopnea or PND. No palpitations.     Currently feeling well with no chest pain but still has a HA. She did have some return of chest pain while getting up to go to the bathroom last night that was completely resolved by SL NTG.  Past Medical History:  Diagnosis Date  . Basal cell carcinoma    back; chest; ankle/lower leg; face  . Heart murmur   . HLD (hyperlipidemia)   . Hypertension   . Hypothyroidism   . Syncope 08/03/2016     Past Surgical History:  Procedure Laterality Date  . ABDOMINAL HYSTERECTOMY  1993  . APPENDECTOMY  1976  . BACK SURGERY    . BASAL CELL CARCINOMA EXCISION Right 06/2010   scapula  . BASAL CELL CARCINOMA EXCISION Right 07/2016   chest  . [place]  . LAMINECTOMY AND MICRODISCECTOMY THORACIC SPINE Left 10/2007   Left sided T11-12 transthoracic microdiskectomy/notes 09/20/2010  . MOHS SURGERY Right 2012; 2017   ankle/lower leg; face  . TUBAL LIGATION      Allergies  Allergen Reactions  . Codeine Nausea And Vomiting    I have reviewed the patient's current medications . azithromycin  250 mg Oral Q24H  . enoxaparin (LOVENOX) injection  40 mg Subcutaneous Q24H  . levothyroxine  150 mcg Oral QAC breakfast  . lisinopril  5 mg Oral Daily  . rosuvastatin  40 mg Oral q1800  . venlafaxine XR  37.5 mg Oral Daily    acetaminophen, nitroGLYCERIN, ondansetron (ZOFRAN) IV, sodium chloride  Prior to Admission medications   Medication Sig Start Date End Date Taking? Authorizing Provider  acetaminophen (TYLENOL) 500 MG tablet Take 1,000 mg by mouth every 6 (six) hours as needed for moderate pain.    Yes Historical Provider, MD  cholecalciferol (VITAMIN D) 400 units TABS tablet Take 400 Units by mouth daily.   Yes Historical Provider, MD  CRESTOR 40 MG tablet Take 40 mg by mouth every evening.  06/17/14  Yes Historical Provider, MD  estradiol (ESTRACE) 2 MG tablet Take 1 mg by mouth daily.  06/09/14  Yes Historical Provider, MD  lisinopril (PRINIVIL,ZESTRIL) 5 MG tablet Take 5 mg by  mouth at bedtime.  06/17/14  Yes Historical Provider, MD  Omega-3 1000 MG CAPS Take 1,000 mg by mouth daily.   Yes Historical Provider, MD  SYNTHROID 150 MCG tablet Take 150 mcg by mouth daily.  06/30/14  Yes Historical Provider, MD  venlafaxine XR (EFFEXOR-XR) 37.5 MG 24 hr capsule Take 1 capsule by mouth daily. 07/02/14  Yes Historical Provider, MD     Social History   Social History  . Marital status: Married    Spouse name: N/A  . Number of children: N/A  . Years of education: N/A   Occupational History  . Not on file.   Social History Main Topics  . Smoking status: Never Smoker  . Smokeless tobacco: Never Used  . Alcohol use Yes     Comment: 08/03/2016 "might have 1 drink/year;  if that"  . Drug use: No  . Sexual activity: Yes   Other Topics Concern  . Not on file   Social History Narrative  . No narrative on file    Family Status  Relation Status  . Mother   . Father Other  . Neg Hx    Family History  Problem Relation Age of Onset  . Lung cancer Mother   . Hypertension Mother   . Ovarian cancer Neg Hx        ROS:  Full 14 point review of systems complete and found to be negative unless listed above.  Physical Exam: Blood pressure 116/70, pulse 81, temperature 97.7 F (36.5 C), temperature source Oral, resp. rate (!) 24, height 5\' 2"  (1.575 m), weight 167 lb 4.8 oz (75.9 kg), SpO2 96 %.  General: Well developed, well nourished, female in no acute distress Head: Eyes PERRLA, No xanthomas.   Normocephalic and atraumatic, oropharynx without edema or exudate.  Lungs: CTAB Heart: HRRR S1 S2, no rub/gallop, Heart regular rate and rhythm with S1, S2 , no murmur. pulses are 2+ extrem.   Neck: No carotid bruits. No lymphadenopathy. no JVD. Abdomen: Bowel sounds present, abdomen soft and non-tender without masses or hernias noted. Msk:  No spine or cva tenderness. No weakness, no joint deformities or effusions. Extremities: No clubbing or cyanosis. No LE  edema.  Neuro:  Alert and oriented X 3. No focal deficits noted. Psych:  Good affect, responds appropriately Skin: No rashes or lesions noted.  Labs:   Lab Results  Component Value Date   WBC 5.3 08/03/2016   HGB 12.7 08/03/2016   HCT 36.8 08/03/2016   MCV 90.2 08/03/2016   PLT 186 08/03/2016   No results for input(s): INR in the last 72 hours.   Recent Labs Lab 08/03/16 1430 08/03/16 1944  NA 139  --   K 3.5  --   CL 100*  --   CO2 29  --   BUN 12  --  CREATININE 0.64 0.69  CALCIUM 9.4  --   GLUCOSE 93  --    Magnesium  Date Value Ref Range Status  07/06/2010 2.1 1.5 - 2.5 mg/dL Final    Recent Labs  08/03/16 1944 08/04/16 0200  TROPONINI <0.03 <0.03    Recent Labs  08/03/16 1453  TROPIPOC 0.00   No results found for: PROBNP Lab Results  Component Value Date   CHOL 104 08/04/2016   HDL 49 08/04/2016   LDLCALC 28 08/04/2016   TRIG 134 08/04/2016   Lab Results  Component Value Date   DDIMER <0.27 08/03/2016   Lipase  Date/Time Value Ref Range Status  11/24/2007 08:25 AM 15  Final   TSH  Date/Time Value Ref Range Status  08/04/2016 05:12 AM 0.102 (L) 0.350 - 4.500 uIU/mL Final    Comment:    Performed by a 3rd Generation assay with a functional sensitivity of <=0.01 uIU/mL.   No results found for: VITAMINB12, FOLATE, FERRITIN, TIBC, IRON, RETICCTPCT  Echo: pending  ECG:  NSR, HR 87  Radiology:  Dg Chest 2 View  Result Date: 08/03/2016 CLINICAL DATA:  Central chest pain and nausea for 1 day, history hypertension EXAM: CHEST  2 VIEW COMPARISON:  07/05/2010 FINDINGS: Enlargement of cardiac silhouette. Mediastinal contours and pulmonary vascularity normal. Chronic accentuation of LEFT basilar markings appears unchanged. No definite acute infiltrate, pleural effusion or pneumothorax. Mild endplate spur formation lower thoracic spine. IMPRESSION: Enlargement of cardiac silhouette without definite acute infiltrate. Electronically Signed   By: Lavonia Dana M.D.   On:  08/03/2016 18:36   Ct Head Wo Contrast  Result Date: 08/03/2016 CLINICAL DATA:  Fall.  Frontal headache, weakness and near syncope. EXAM: CT HEAD WITHOUT CONTRAST TECHNIQUE: Contiguous axial images were obtained from the base of the skull through the vertex without intravenous contrast. COMPARISON:  None FINDINGS: Brain: No evidence of acute infarction, hemorrhage, hydrocephalus, extra-axial collection or mass lesion/mass effect. Vascular: No hyperdense vessel or unexpected calcification. Skull: Normal. Negative for fracture or focal lesion. Sinuses/Orbits: Opacification of the right side of sphenoid sinus noted. The mastoid air cells are clear the calvarium appears intact. Other: None IMPRESSION: 1. No acute intracranial abnormalities. 2. Sphenoid sinus opacification. Electronically Signed   By: Kerby Moors M.D.   On: 08/03/2016 17:30    ASSESSMENT AND PLAN:    Principal Problem:   Chest pain Active Problems:   Sinusitis   HTN (hypertension)   HLD (hyperlipidemia)   Hypothyroidism  Kiffany Schelling is a 61 y.o. female with a history of HTN, HLD, hypothyroidism and no past cardiac history who presented to [facility] ED on 08/03/16 for evaluation of chest pressure, weakness/presyncope and HA.  Chest pain: no objective signs of ischemia. She does have RFs for CAD including obesity, HTN and HLD but it seems like her RFs have been well controlled (LDL 27) and BP usually at goal. 2D ECHO pending. Would plan for inpatient vs outpatient nuc. Will defer to Dr. Debara Pickett.   Suppressed TSH: will check a free T4. Some of her symptoms could be related to over supplementation with synthroid. Will leave dosing adjustments to IM.   Hypertensive urgency: her BP did get up to 220/90. No evidence of end organ damage. This has come down and currently normal  HA/weakness/presyncope: ? Vagal episode. ? If hyperthyroidism contributing. Currently doing better.   HLD: lipids under excellent control. Continue Crestor and fish  oil.   Hx of murmur: I did not appreciate this on physical exam. 2D  ECHO pending.    Signed: Angelena Form, PA-C 08/04/2016 8:17 AM  Pager [phone]  Co-Sign MD

## 2016-08-04 NOTE — Discharge Summary (Signed)
Physician Discharge Summary  [name] HKV:[id number] DOB: 05-21-1956 DOA: 08/03/2016  PCP: Reginia Naas, MD  Admit date: 08/03/2016 Discharge date: 08/04/2016  Admitted From: Home Disposition:  Home  Recommendations for Outpatient Follow-up:  1. Follow up with PCP in 1 week 2. Follow up with Cardiology for outpatient stress test as scheduled on 3/22 at 7:45am, nothing to eat after midnight the night before. 3. Will need repeat TSH and free T4 as outpatient with adjustment in synthroid per PCP.  Home Health: No  Equipment/Devices: None   Discharge Condition: Stable CODE STATUS: Full  Diet recommendation: Heart healthy   Brief/Interim Summary: Michelle Kent is a 61 y.o. female with medical history significant of non-smoker, HTN, HLD who presents to the ED with complaints of feeling flushed with chest pressure/heaviness on the day of admission. She states that she was working on the day when she started to feel ill. She admits to chest pressure, pounding headache as well as weakness. She went to the bathroom to splash water on her face when her coworkers called EMS. When EMS arrived, her blood pressure was found to be 220/90. Chest pain resolved with nitroglycerin and patient was transferred to [facility]. Cardiology was consulted for evaluation. EKG was unremarkable, troponin was negative. D-dimer was negative. TSH was low but free T4 was within normal limits. Blood pressure improved on her normal lisinopril dose. Cardiology feels that patient can follow up as outpatient for outpatient stress test. This is scheduled prior to patient's discharge.  Discharge Diagnoses:  Principal Problem:   Chest pain Active Problems:   Sinusitis   HTN (hypertension)   HLD (hyperlipidemia)   Hypothyroidism  Chest pain -EKG without ischemic change -Troponin negative -Cardiology consulted -Patient stress test scheduled -Chest pain free at time of discharge  Sinusitis  -Seen on CT head  but without overt symptoms of bacterial sinusitis, no fevers, white count  -Monitor, supportive care  HTN -Lisinopril -BP stable  HLD -Crestor   Hypothyroidism -Continue Synthroid -TSH found to be 0.102, free T4 0.99. Not consistent with hyperthyroidism  -Recommend repeat testing as outpatient in a few weeks with adjustment in Synthroid dose per PCP  Discharge Instructions  Discharge Instructions    Call MD for:  difficulty breathing, headache or visual disturbances    Complete by:  As directed    Call MD for:  extreme fatigue    Complete by:  As directed    Call MD for:  persistant dizziness or light-headedness    Complete by:  As directed    Call MD for:  persistant nausea and vomiting    Complete by:  As directed    Call MD for:  severe uncontrolled pain    Complete by:  As directed    Call MD for:  temperature >100.4    Complete by:  As directed    Diet - low sodium heart healthy    Complete by:  As directed    Increase activity slowly    Complete by:  As directed      Allergies as of 08/04/2016      Reactions   Codeine Nausea And Vomiting      Medication List    TAKE these medications   acetaminophen 500 MG tablet Commonly known as:  TYLENOL Take 1,000 mg by mouth every 6 (six) hours as needed for moderate pain.   cholecalciferol 400 units Tabs tablet Commonly known as:  VITAMIN D Take 400 Units by mouth daily.   CRESTOR  40 MG tablet Generic drug:  rosuvastatin Take 40 mg by mouth every evening.   estradiol 2 MG tablet Commonly known as:  ESTRACE Take 1 mg by mouth daily.   lisinopril 5 MG tablet Commonly known as:  PRINIVIL,ZESTRIL Take 5 mg by mouth at bedtime.   Omega-3 1000 MG Caps Take 1,000 mg by mouth daily.   SYNTHROID 150 MCG tablet Generic drug:  levothyroxine Take 150 mcg by mouth daily.   venlafaxine XR 37.5 MG 24 hr capsule Commonly known as:  EFFEXOR-XR Take 1 capsule by mouth daily.      Follow-up Information    [facility] Follow up on 08/10/2016.   Why:  @ 7:45 am for  your stress test. Nothing to eat after midnight the night before. Contact information: [address] [place] [address] [phone]       Reginia Naas, MD. Schedule an appointment as soon as possible for a visit in 1 week(s).   Specialty:  Family Medicine Contact information: [place] [address] [phone]          Allergies  Allergen Reactions  . Codeine Nausea And Vomiting    Consultations:  Cardiology   Procedures/Studies: Dg Chest 2 View  Result Date: 08/03/2016 CLINICAL DATA:  Central chest pain and nausea for 1 day, history hypertension EXAM: CHEST  2 VIEW COMPARISON:  07/05/2010 FINDINGS: Enlargement of cardiac silhouette. Mediastinal contours and pulmonary vascularity normal. Chronic accentuation of LEFT basilar markings appears unchanged. No definite acute infiltrate, pleural effusion or pneumothorax. Mild endplate spur formation lower thoracic spine. IMPRESSION: Enlargement of cardiac silhouette without definite acute infiltrate. Electronically Signed   By: Lavonia Dana M.D.   On: 08/03/2016 18:36   Ct Head Wo Contrast  Result Date: 08/03/2016 CLINICAL DATA:  Fall.  Frontal headache, weakness and near syncope. EXAM: CT HEAD WITHOUT CONTRAST TECHNIQUE: Contiguous axial images were obtained from the base of the skull through the vertex without intravenous contrast. COMPARISON:  None FINDINGS: Brain: No evidence of acute infarction, hemorrhage, hydrocephalus, extra-axial collection or mass lesion/mass effect. Vascular: No hyperdense vessel or unexpected calcification. Skull: Normal. Negative for fracture or focal lesion. Sinuses/Orbits: Opacification of the right side of sphenoid sinus noted. The mastoid air cells are clear the calvarium appears intact. Other: None IMPRESSION: 1. No acute intracranial  abnormalities. 2. Sphenoid sinus opacification. Electronically Signed   By: Kerby Moors M.D.   On: 08/03/2016 17:30       Discharge Exam: Vitals:   08/04/16 0821 08/04/16 0833  BP: 128/67   Pulse:    Resp:    Temp:  98.3 F (36.8 C)   Vitals:   08/03/16 2202 08/04/16 0529 08/04/16 0821 08/04/16 0833  BP: 136/74 116/70 128/67   Pulse: 81 81    Resp:      Temp:  97.7 F (36.5 C)  98.3 F (36.8 C)  TempSrc:  Oral  Oral  SpO2:  96%  100%  Weight:  75.9 kg (167 lb 4.8 oz)    Height:        General: Pt is alert, awake, not in acute distress Cardiovascular: RRR, S1/S2 +, no rubs, no gallops Respiratory: CTA bilaterally, no wheezing, no rhonchi Abdominal: Soft, NT, ND, bowel sounds + Extremities: no edema, no cyanosis    The results of significant diagnostics from this hospitalization (including imaging, microbiology, ancillary and laboratory) are listed below for reference.     Microbiology:  No results found for this or any previous visit (from the past 240 hour(s)).   Labs: BNP (last 3 results) No results for input(s): BNP in the last 8760 hours. Basic Metabolic Panel:  Recent Labs Lab 08/03/16 1430 08/03/16 1944  NA 139  --   K 3.5  --   CL 100*  --   CO2 29  --   GLUCOSE 93  --   BUN 12  --   CREATININE 0.64 0.69  CALCIUM 9.4  --    Liver Function Tests: No results for input(s): AST, ALT, ALKPHOS, BILITOT, PROT, ALBUMIN in the last 168 hours. No results for input(s): LIPASE, AMYLASE in the last 168 hours. No results for input(s): AMMONIA in the last 168 hours. CBC:  Recent Labs Lab 08/03/16 1430 08/03/16 1944  WBC 6.4 5.3  HGB 14.1 12.7  HCT 41.4 36.8  MCV 90.6 90.2  PLT 216 186   Cardiac Enzymes:  Recent Labs Lab 08/03/16 1944 08/04/16 0200 08/04/16 0812  TROPONINI <0.03 <0.03 <0.03   BNP: Invalid input(s): POCBNP CBG: No results for input(s): GLUCAP in the last 168 hours. D-Dimer  Recent Labs  08/03/16 1626  DDIMER <0.27    Hgb A1c No results for input(s): HGBA1C in the last 72 hours. Lipid Profile  Recent Labs  08/04/16 0512  CHOL 104  HDL 49  LDLCALC 28  TRIG 134  CHOLHDL 2.1   Thyroid function studies  Recent Labs  08/04/16 0512  TSH 0.102*   Anemia work up No results for input(s): VITAMINB12, FOLATE, FERRITIN, TIBC, IRON, RETICCTPCT in the last 72 hours. Urinalysis    Component Value Date/Time   COLORURINE COLORLESS (A) 08/03/2016 1452   APPEARANCEUR CLEAR 08/03/2016 1452   LABSPEC 1.002 (L) 08/03/2016 1452   PHURINE 6.0 08/03/2016 1452   GLUCOSEU NEGATIVE 08/03/2016 1452   HGBUR NEGATIVE 08/03/2016 1452   BILIRUBINUR NEGATIVE 08/03/2016 1452   KETONESUR NEGATIVE 08/03/2016 1452   PROTEINUR NEGATIVE 08/03/2016 1452   UROBILINOGEN 0.2 07/09/2014 0126   NITRITE NEGATIVE 08/03/2016 1452   LEUKOCYTESUR NEGATIVE 08/03/2016 1452   Sepsis Labs Invalid input(s): PROCALCITONIN,  WBC,  LACTICIDVEN Microbiology No results found for this or any previous visit (from the past 240 hour(s)).   Time coordinating discharge: 40 minutes  SIGNED:  Dessa Phi, DO [facility] Pager [phone]  If 7PM-7AM, please contact night-coverage [email or web address] Password [email or web address] 08/04/2016, 1:10 PM

## 2016-08-04 NOTE — Progress Notes (Signed)
ca

## 2016-08-04 NOTE — Progress Notes (Signed)
Pt had c/o of left calf muscle  sharp pain especially when she elevates her left leg. Kindly address.

## 2016-08-05 LAB — HEMOGLOBIN A1C
HEMOGLOBIN A1C: 5.8 % — AB (ref 4.8–5.6)
Mean Plasma Glucose: 120 mg/dL

## 2016-08-08 ENCOUNTER — Telehealth (HOSPITAL_COMMUNITY): Payer: Self-pay | Admitting: *Deleted

## 2016-08-08 NOTE — Telephone Encounter (Signed)
Patient given detailed instructions per Myocardial Perfusion Study Information Sheet for the test on 08/10/16 at 0745. Patient notified to arrive 15 minutes early and that it is imperative to arrive on time for appointment to keep from having the test rescheduled.  If you need to cancel or reschedule your appointment, please call the office within 24 hours of your appointment. Failure to do so may result in a cancellation of your appointment, and a $50 no show fee. Patient verbalized understanding.Amy Gothard, [place]anae [place]

## 2016-08-10 ENCOUNTER — Ambulatory Visit (HOSPITAL_COMMUNITY): Payer: BLUE CROSS/BLUE SHIELD | Attending: Cardiovascular Disease

## 2016-08-10 DIAGNOSIS — R079 Chest pain, unspecified: Secondary | ICD-10-CM | POA: Diagnosis not present

## 2016-08-10 DIAGNOSIS — I10 Essential (primary) hypertension: Secondary | ICD-10-CM | POA: Insufficient documentation

## 2016-08-10 DIAGNOSIS — I2510 Atherosclerotic heart disease of native coronary artery without angina pectoris: Secondary | ICD-10-CM | POA: Diagnosis present

## 2016-08-10 LAB — MYOCARDIAL PERFUSION IMAGING
CHL CUP NUCLEAR SDS: 0
CHL CUP RESTING HR STRESS: 63 {beats}/min
CSEPPHR: 103 {beats}/min
LV dias vol: 74 mL (ref 46–106)
LVSYSVOL: 16 mL
RATE: 0.28
SRS: 0
SSS: 0
TID: 0.9

## 2016-08-10 MED ORDER — REGADENOSON 0.4 MG/5ML IV SOLN
0.4000 mg | Freq: Once | INTRAVENOUS | Status: AC
  Administered 2016-08-10: 0.4 mg via INTRAVENOUS

## 2016-08-10 MED ORDER — TECHNETIUM TC 99M TETROFOSMIN IV KIT
10.6000 | PACK | Freq: Once | INTRAVENOUS | Status: AC | PRN
  Administered 2016-08-10: 10.6 via INTRAVENOUS
  Filled 2016-08-10: qty 11

## 2016-08-10 MED ORDER — TECHNETIUM TC 99M TETROFOSMIN IV KIT
32.3000 | PACK | Freq: Once | INTRAVENOUS | Status: AC | PRN
  Administered 2016-08-10: 32.3 via INTRAVENOUS
  Filled 2016-08-10: qty 33

## 2016-08-11 ENCOUNTER — Telehealth: Payer: Self-pay | Admitting: Physician Assistant

## 2016-08-11 NOTE — Telephone Encounter (Signed)
-----   Message from Eileen Stanford, [place] sent at 08/10/2016  4:02 PM EDT ----- Low risk stress test

## 2016-08-11 NOTE — Telephone Encounter (Signed)
New message  ° ° ° °Pt is returning call to Jennifer about results. °

## 2017-06-11 ENCOUNTER — Other Ambulatory Visit: Payer: Self-pay | Admitting: Family Medicine

## 2017-06-11 DIAGNOSIS — Z139 Encounter for screening, unspecified: Secondary | ICD-10-CM

## 2017-06-27 ENCOUNTER — Other Ambulatory Visit: Payer: Self-pay | Admitting: Family Medicine

## 2017-06-27 DIAGNOSIS — R1011 Right upper quadrant pain: Secondary | ICD-10-CM

## 2017-06-28 ENCOUNTER — Ambulatory Visit
Admission: RE | Admit: 2017-06-28 | Discharge: 2017-06-28 | Disposition: A | Discharge status: Home / Self Care | Payer: BLUE CROSS/BLUE SHIELD | Source: Ambulatory Visit | Attending: Family Medicine | Admitting: Family Medicine

## 2017-06-28 DIAGNOSIS — R1011 Right upper quadrant pain: Secondary | ICD-10-CM

## 2017-06-28 DIAGNOSIS — Z139 Encounter for screening, unspecified: Secondary | ICD-10-CM

## 2018-05-11 ENCOUNTER — Other Ambulatory Visit: Payer: Self-pay

## 2018-05-11 ENCOUNTER — Encounter (HOSPITAL_COMMUNITY): Payer: Self-pay | Admitting: Emergency Medicine

## 2018-05-11 ENCOUNTER — Emergency Department (HOSPITAL_COMMUNITY): Payer: BLUE CROSS/BLUE SHIELD

## 2018-05-11 ENCOUNTER — Observation Stay (HOSPITAL_COMMUNITY)
Admission: EM | Admit: 2018-05-11 | Discharge: 2018-05-13 | Disposition: A | Discharge status: Home / Self Care | Payer: BLUE CROSS/BLUE SHIELD | Attending: Family Medicine | Admitting: Family Medicine

## 2018-05-11 DIAGNOSIS — I10 Essential (primary) hypertension: Secondary | ICD-10-CM | POA: Insufficient documentation

## 2018-05-11 DIAGNOSIS — Z79890 Hormone replacement therapy (postmenopausal): Secondary | ICD-10-CM | POA: Diagnosis not present

## 2018-05-11 DIAGNOSIS — Z885 Allergy status to narcotic agent status: Secondary | ICD-10-CM | POA: Diagnosis not present

## 2018-05-11 DIAGNOSIS — E876 Hypokalemia: Secondary | ICD-10-CM | POA: Diagnosis not present

## 2018-05-11 DIAGNOSIS — Z7982 Long term (current) use of aspirin: Secondary | ICD-10-CM | POA: Insufficient documentation

## 2018-05-11 DIAGNOSIS — E785 Hyperlipidemia, unspecified: Secondary | ICD-10-CM | POA: Insufficient documentation

## 2018-05-11 DIAGNOSIS — R079 Chest pain, unspecified: Secondary | ICD-10-CM | POA: Diagnosis present

## 2018-05-11 DIAGNOSIS — F419 Anxiety disorder, unspecified: Secondary | ICD-10-CM | POA: Insufficient documentation

## 2018-05-11 DIAGNOSIS — Z79899 Other long term (current) drug therapy: Secondary | ICD-10-CM | POA: Insufficient documentation

## 2018-05-11 DIAGNOSIS — E039 Hypothyroidism, unspecified: Secondary | ICD-10-CM | POA: Insufficient documentation

## 2018-05-11 DIAGNOSIS — Z85828 Personal history of other malignant neoplasm of skin: Secondary | ICD-10-CM | POA: Insufficient documentation

## 2018-05-11 LAB — CBC
HEMATOCRIT: 39.4 % (ref 36.0–46.0)
Hemoglobin: 13.6 g/dL (ref 12.0–15.0)
MCH: 31.8 pg (ref 26.0–34.0)
MCHC: 34.5 g/dL (ref 30.0–36.0)
MCV: 92.1 fL (ref 80.0–100.0)
Platelets: 220 10*3/uL (ref 150–400)
RBC: 4.28 MIL/uL (ref 3.87–5.11)
RDW: 11.9 % (ref 11.5–15.5)
WBC: 6 10*3/uL (ref 4.0–10.5)
nRBC: 0 % (ref 0.0–0.2)

## 2018-05-11 NOTE — ED Triage Notes (Signed)
Pt c/o chest pressure and pain to the left arm that started today. Denies other associated symptoms. Hx HTN.

## 2018-05-12 ENCOUNTER — Other Ambulatory Visit: Payer: Self-pay

## 2018-05-12 DIAGNOSIS — E039 Hypothyroidism, unspecified: Secondary | ICD-10-CM

## 2018-05-12 DIAGNOSIS — E785 Hyperlipidemia, unspecified: Secondary | ICD-10-CM

## 2018-05-12 DIAGNOSIS — I10 Essential (primary) hypertension: Secondary | ICD-10-CM | POA: Diagnosis not present

## 2018-05-12 DIAGNOSIS — R072 Precordial pain: Secondary | ICD-10-CM

## 2018-05-12 DIAGNOSIS — R079 Chest pain, unspecified: Secondary | ICD-10-CM | POA: Diagnosis not present

## 2018-05-12 LAB — CBC
HCT: 38.5 % (ref 36.0–46.0)
Hemoglobin: 12.7 g/dL (ref 12.0–15.0)
MCH: 30.5 pg (ref 26.0–34.0)
MCHC: 33 g/dL (ref 30.0–36.0)
MCV: 92.3 fL (ref 80.0–100.0)
Platelets: 199 10*3/uL (ref 150–400)
RBC: 4.17 MIL/uL (ref 3.87–5.11)
RDW: 11.7 % (ref 11.5–15.5)
WBC: 5.9 10*3/uL (ref 4.0–10.5)
nRBC: 0 % (ref 0.0–0.2)

## 2018-05-12 LAB — BASIC METABOLIC PANEL
Anion gap: 12 (ref 5–15)
Anion gap: 11 (ref 5–15)
BUN: 14 mg/dL (ref 8–23)
BUN: 18 mg/dL (ref 8–23)
CHLORIDE: 103 mmol/L (ref 98–111)
CO2: 26 mmol/L (ref 22–32)
CO2: 25 mmol/L (ref 22–32)
CREATININE: 0.71 mg/dL (ref 0.44–1.00)
Calcium: 9.5 mg/dL (ref 8.9–10.3)
Calcium: 9.2 mg/dL (ref 8.9–10.3)
Chloride: 104 mmol/L (ref 98–111)
Creatinine, Ser: 0.7 mg/dL (ref 0.44–1.00)
GFR calc Af Amer: >60 mL/min (ref >60)
GFR calc Af Amer: >60 mL/min (ref >60)
GFR calc non Af Amer: >60 mL/min (ref >60)
GFR calc non Af Amer: >60 mL/min (ref >60)
Glucose, Bld: 149 mg/dL — ABNORMAL HIGH (ref 70–99)
Glucose, Bld: 125 mg/dL — ABNORMAL HIGH (ref 70–99)
Potassium: 3.3 mmol/L — ABNORMAL LOW (ref 3.5–5.1)
Potassium: 3.6 mmol/L (ref 3.5–5.1)
Sodium: 141 mmol/L (ref 135–145)
Sodium: 140 mmol/L (ref 135–145)

## 2018-05-12 LAB — HIV ANTIBODY (ROUTINE TESTING W REFLEX): HIV Screen 4th Generation wRfx: NONREACTIVE

## 2018-05-12 LAB — TROPONIN I
Troponin I: <0.03 ng/mL (ref <0.03)
Troponin I: <0.03 ng/mL (ref <0.03)

## 2018-05-12 LAB — I-STAT TROPONIN, ED: Troponin i, poc: 0 ng/mL (ref 0.00–0.08)

## 2018-05-12 MED ORDER — ROSUVASTATIN CALCIUM 20 MG PO TABS
40.0000 mg | ORAL_TABLET | Freq: Every evening | ORAL | Status: DC
  Administered 2018-05-12: 40 mg via ORAL
  Filled 2018-05-12: qty 2

## 2018-05-12 MED ORDER — NITROGLYCERIN 0.4 MG SL SUBL
0.4000 mg | SUBLINGUAL_TABLET | SUBLINGUAL | Status: DC | PRN
  Administered 2018-05-12 (×3): 0.4 mg via SUBLINGUAL
  Filled 2018-05-12: qty 1

## 2018-05-12 MED ORDER — NITROGLYCERIN 2 % TD OINT
1.0000 [in_us] | TOPICAL_OINTMENT | Freq: Once | TRANSDERMAL | Status: DC | PRN
  Filled 2018-05-12: qty 30

## 2018-05-12 MED ORDER — ASPIRIN EC 325 MG PO TBEC
325.0000 mg | DELAYED_RELEASE_TABLET | Freq: Every day | ORAL | Status: DC
  Administered 2018-05-12: 325 mg via ORAL
  Filled 2018-05-12 (×2): qty 1

## 2018-05-12 MED ORDER — ONDANSETRON HCL 4 MG/2ML IJ SOLN: 4.0000 mg | Freq: Four times a day (QID) | INTRAMUSCULAR | Status: DC | PRN

## 2018-05-12 MED ORDER — CYCLOBENZAPRINE HCL 5 MG PO TABS
5.0000 mg | ORAL_TABLET | Freq: Once | ORAL | Status: AC
  Administered 2018-05-12: 5 mg via ORAL
  Filled 2018-05-12: qty 1

## 2018-05-12 MED ORDER — LEVOTHYROXINE SODIUM 75 MCG PO TABS
150.0000 ug | ORAL_TABLET | Freq: Every day | ORAL | Status: DC
  Administered 2018-05-12 – 2018-05-13 (×2): 150 ug via ORAL
  Filled 2018-05-12: qty 2
  Filled 2018-05-12: qty 1
  Filled 2018-05-12: qty 2

## 2018-05-12 MED ORDER — LISINOPRIL 5 MG PO TABS
5.0000 mg | ORAL_TABLET | Freq: Every day | ORAL | Status: DC
  Administered 2018-05-12: 5 mg via ORAL
  Filled 2018-05-12: qty 1

## 2018-05-12 MED ORDER — ACETAMINOPHEN 325 MG PO TABS
650.0000 mg | ORAL_TABLET | ORAL | Status: DC | PRN
  Administered 2018-05-12: 650 mg via ORAL
  Filled 2018-05-12: qty 2

## 2018-05-12 MED ORDER — VENLAFAXINE HCL ER 37.5 MG PO CP24
37.5000 mg | ORAL_CAPSULE | Freq: Every day | ORAL | Status: DC
  Administered 2018-05-12: 37.5 mg via ORAL
  Filled 2018-05-12 (×2): qty 1

## 2018-05-12 MED ORDER — POTASSIUM CHLORIDE CRYS ER 20 MEQ PO TBCR
40.0000 meq | EXTENDED_RELEASE_TABLET | Freq: Once | ORAL | Status: AC
  Administered 2018-05-12: 40 meq via ORAL
  Filled 2018-05-12: qty 2

## 2018-05-12 MED ORDER — ENOXAPARIN SODIUM 40 MG/0.4ML ~~LOC~~ SOLN
40.0000 mg | SUBCUTANEOUS | Status: DC
  Administered 2018-05-12: 40 mg via SUBCUTANEOUS
  Filled 2018-05-12: qty 0.4

## 2018-05-12 NOTE — ED Notes (Signed)
Pt able to ambulate to bathroom without assistance

## 2018-05-12 NOTE — H&P (Addendum)
[facility] Admission History and Physical Service Pager: [phone]  Patient name: Michelle Kent Medical record number: [id number] Date of birth: 28-Dec-1955 Age: 62 y.o. Gender: female  Primary Care Provider: Carol Ada, MD Consultants: Cardio Code Status: DNR  Chief Complaint: Chest pain  Assessment and Plan: Michelle Kent is a 63 y.o. female presenting with centrally located chest pain. PMH is significant for hypertension, hypothyroidism, and hyperlipidemia.  Chest pain: chest pain is described as a substernal pressure unresponsive to nitroglycerin. Troponins are trending, first two readings negative. EKG shows possible old anteroseptal infarction (which is unchanged from previous). The chest pain is atypical in nature and does not worsen or improve with exertion, rest, or nitroglycerin. As the patient's EKG and troponins are negative the chest pain is most likely not cardiac in nature, but a cardiac cause still needs to be ruled out. Other potential causes of her chest pain include a musculoskeletal etiology, such as costochondritis, although symptoms are unchanged with palpation of the chest. The patient also reports having some neck pain with tense muscles palpated on exam which could be contributing to chest and head pain. PE less likely as wells score is 0. Pulmonary source of pain was not appreciated on chest x-ray. Anxiety is most likely a contributing factor to the patient's symptoms.  -Admit to Tele, Attending Dr. Mingo Amber -consult cardiology in am -AM BMP, CBC, Cr -Up with Assistance -Vitals per shift -Continuous pulse ox -Heart healthy diet -AM EKG 12/22 -Trend troponins q3 hours: 0.00, <0.03 -Nitro PRN chest pain, Zofran PRN Nausea -Aspirin daily -Tylenol PRN Pain  Hypothyroidism: patient has a history of hypothyroidism. No recent TSH on record. Patient reports taking synthroid as prescribed. Takes 150 mcg synthroid daily. -Continue home  synthroid 150 mcg  Hypokalemia: Patient's potassium on admission is 3.3. -Replete with K-Dur 40 mEq x 1  Anxiety: patient has a reported history of anxiety and takes takes venlafaxine 37.5mg  "to calm her down". She takes this medication daily.  -Continue venlafaxine.  Hypertension: blood pressure on admission as high as 166/82, but hovering in the 120/60s. Patient takes Lisinopril 5mg  daily each evening.  -Continue Lisinopril   Hyperlipidemia: patient takes Crestor 40 daily.  -Continue crestor  FEN/GI: no fluids, heart-healthy diet Prophylaxis: Lovenox  Disposition: admit to Tele, discharge home once cleared by Cards  History of Present Illness:  Michelle Kent is a 62 y.o. female presenting with feeling her heart is "heavy and fluttering". She states she has had this feeling of fluttering intermittently for the last few months, maybe 1-3 brief episodes, but it is been worse this week. She also reports pain going to her left arm from her left chest and she reports having a frontal headache that feels like pressure. She says she can feel when her BP is up secondary to her headache vs nitro.  She also states her blood pressure has been fluctuating this week and was up to 631 systolic today. This week she went to her PCP's office for high blood pressure but no changes were made to her regimen. She has been feeling tired today and has been unsteady on her feet.   She denies any activities alleviate or aggravate her symptoms. The fluttering goes away on its own and does not change with exertion. She reports experiencing minimal shortness of breath and feels more tired. Denies changes with nitro in the ED. She denies cough.   She used to be on estrogen but stopped in September 2019 because her sister  has breast cancer.   Of note, the patient has been a Scientist, water quality at [employer] for 26 years. She does not smoke or drink. She did not take her lisinopril today because she usually takes it at night.   Review Of  Systems: Per HPI with the following additions:   Review of Systems  Constitutional: Positive for malaise/fatigue.  Respiratory: Positive for sputum production (on exertion). Negative for cough.   Cardiovascular: Positive for chest pain (centralized, pressure) and palpitations.  Gastrointestinal: Negative for abdominal pain, nausea and vomiting.  Neurological: Positive for headaches (frontal).   Patient Active Problem List   Diagnosis Date Noted  . HLD (hyperlipidemia)   . Hypothyroidism   . Chest pain 08/03/2016  . Sinusitis 08/03/2016  . HTN (hypertension) 08/03/2016   Past Medical History: Past Medical History:  Diagnosis Date  . Basal cell carcinoma    back; chest; ankle/lower leg; face  . Heart murmur   . HLD (hyperlipidemia)   . Hypertension   . Hypothyroidism   . Syncope 08/03/2016   Past Surgical History: Past Surgical History:  Procedure Laterality Date  . ABDOMINAL HYSTERECTOMY  1993  . APPENDECTOMY  1976  . BACK SURGERY    . BASAL CELL CARCINOMA EXCISION Right 06/2010   scapula  . BASAL CELL CARCINOMA EXCISION Right 07/2016   chest  . [place]  . LAMINECTOMY AND MICRODISCECTOMY THORACIC SPINE Left 10/2007   Left sided T11-12 transthoracic microdiskectomy/notes 09/20/2010  . MOHS SURGERY Right 2012; 2017   ankle/lower leg; face  . TUBAL LIGATION      Social History: Social History   Tobacco Use  . Smoking status: Never Smoker  . Smokeless tobacco: Never Used  Substance Use Topics  . Alcohol use: Yes    Comment: 08/03/2016 "might have 1 drink/year;  if that"  . Drug use: No   Additional social history: none Please also refer to relevant sections of EMR.  Family History: Family History  Problem Relation Age of Onset  . Lung cancer Mother   . Hypertension Mother   . Breast cancer Maternal Grandmother 78  . Breast cancer Cousin   . Ovarian cancer Neg Hx     Allergies and Medications: Allergies  Allergen Reactions  . Codeine  Nausea And Vomiting   No current facility-administered medications on file prior to encounter.    Current Outpatient Medications on File Prior to Encounter  Medication Sig Dispense Refill  . acetaminophen (TYLENOL) 500 MG tablet Take 1,000 mg by mouth every 6 (six) hours as needed for moderate pain.     . cholecalciferol (VITAMIN D) 400 units TABS tablet Take 400 Units by mouth daily.    . CRESTOR 40 MG tablet Take 40 mg by mouth every evening.   5  . estradiol (ESTRACE) 2 MG tablet Take 1 mg by mouth daily.   5  . lisinopril (PRINIVIL,ZESTRIL) 5 MG tablet Take 5 mg by mouth at bedtime.   5  . Omega-3 1000 MG CAPS Take 1,000 mg by mouth daily.    Marland Kitchen SYNTHROID 150 MCG tablet Take 150 mcg by mouth daily.   3  . venlafaxine XR (EFFEXOR-XR) 37.5 MG 24 hr capsule Take 1 capsule by mouth daily.     Objective: BP 139/63   Pulse 72   Temp 98 F (36.7 C) (Oral)   Resp 13   SpO2 98%   Physical Exam Constitutional:      Appearance: She is well-developed.  Cardiovascular:  Rate and Rhythm: Normal rate and regular rhythm.     Pulses:          Radial pulses are 3+ on the right side and 3+ on the left side.       Dorsalis pedis pulses are 3+ on the right side and 3+ on the left side.     Heart sounds: Normal heart sounds.  Pulmonary:     Effort: Pulmonary effort is normal.     Breath sounds: Normal breath sounds.  Neurological:     General: No focal deficit present.     Mental Status: She is alert.  Psychiatric:        Mood and Affect: Mood is anxious.   Labs and Imaging: CBC BMET  Recent Labs  Lab 05/11/18 2325  WBC 6.0  HGB 13.6  HCT 39.4  PLT 220   Recent Labs  Lab 05/11/18 2325  NA 141  K 3.3*  CL 103  CO2 26  BUN 14  CREATININE 0.71  GLUCOSE 149*  CALCIUM 9.5     iStat troponin: 0.00 Troponin: <0.03 CXR 12/21: no active cardiopulmonary disease. Stable cardiomegaly. EKG: unchanged from previous, possible old anteroseptal infarction, otherwise wnl  Daisy Floro, DO 05/12/2018, 1:54 AM PGY-1, [place] Intern pager: [phone], text pages welcome  FPTS Upper-Level Resident Addendum   I have independently interviewed and examined the patient. I have discussed the above with the original author and agree with their documentation. My edits for correction/addition/clarification are in blue. Please see also any attending notes.    Ralene Ok, MD PGY-3, [place] Service pager: [phone] (text pages welcome through [facility])

## 2018-05-12 NOTE — ED Notes (Signed)
Cardiology at bedside.

## 2018-05-12 NOTE — ED Notes (Signed)
Attempted to give report, RN stated that she was in a contact room and would return phone call

## 2018-05-12 NOTE — ED Notes (Signed)
Ordered breakfast tray heart healthy- Michelle Kent

## 2018-05-12 NOTE — ED Provider Notes (Signed)
[facility] EMERGENCY DEPARTMENT Provider Note   CSN: [id number] Arrival date & time: 05/11/18  2308     History   Chief Complaint Chief Complaint  Patient presents with  . Chest Pain    HPI Michelle Kent is a 62 y.o. female.  The history is provided by the patient.  Chest Pain   This is a new problem. The current episode started 3 to 5 hours ago. The problem occurs hourly. The problem has not changed since onset.The pain is associated with exertion and walking. The pain is present in the substernal region. The pain is at a severity of 5/10. The pain is moderate. The quality of the pain is described as pressure-like. The pain radiates to the left shoulder and left arm. Associated symptoms include exertional chest pressure, leg pain, numbness and palpitations. Pertinent negatives include no abdominal pain, no back pain, no claudication, no cough, no diaphoresis, no dizziness, no fever, no headaches, no hemoptysis, no irregular heartbeat, no lower extremity edema, no malaise/fatigue, no nausea, no near-syncope, no orthopnea, no PND, no shortness of breath, no sputum production, no syncope, no vomiting and no weakness. She has tried nothing for the symptoms. The treatment provided no relief. Risk factors include being elderly, obesity and sedentary lifestyle.  Her past medical history is significant for cancer, hyperlipidemia, hypertension and thyroid problem.  Pertinent negatives for past medical history include no aneurysm, no anxiety/panic attacks, no aortic aneurysm, no aortic dissection, no arrhythmia, no bicuspid aortic valve, no CAD, no congenital heart disease, no connective tissue disease, no COPD, no CHF, no diabetes, no DVT, no hyperhomocysteinemia, no Kawasaki disease, no Marfan's syndrome, no MI, no mitral valve prolapse, no pacemaker, no PE, no PVD, no recent injury, no rheumatic fever, no seizures, no sickle cell disease, no sleep apnea, no spontaneous pneumothorax,  no stimulant use, no strokes, no TIA, Turner syndrome and no valve disorder.  Procedure history is negative for cardiac catheterization, echocardiogram, EPS study, persantine thallium, stress echo, stress thallium and exercise treadmill test.    Past Medical History:  Diagnosis Date  . Basal cell carcinoma    back; chest; ankle/lower leg; face  . Heart murmur   . HLD (hyperlipidemia)   . Hypertension   . Hypothyroidism   . Syncope 08/03/2016    Patient Active Problem List   Diagnosis Date Noted  . HLD (hyperlipidemia)   . Hypothyroidism   . Chest pain 08/03/2016  . Sinusitis 08/03/2016  . HTN (hypertension) 08/03/2016    Past Surgical History:  Procedure Laterality Date  . ABDOMINAL HYSTERECTOMY  1993  . APPENDECTOMY  1976  . BACK SURGERY    . BASAL CELL CARCINOMA EXCISION Right 06/2010   scapula  . BASAL CELL CARCINOMA EXCISION Right 07/2016   chest  . [place]  . LAMINECTOMY AND MICRODISCECTOMY THORACIC SPINE Left 10/2007   Left sided T11-12 transthoracic microdiskectomy/notes 09/20/2010  . MOHS SURGERY Right 2012; 2017   ankle/lower leg; face  . TUBAL LIGATION       OB History   No obstetric history on file.      Home Medications    Prior to Admission medications   Medication Sig Start Date End Date Taking? Authorizing Provider  acetaminophen (TYLENOL) 500 MG tablet Take 1,000 mg by mouth every 6 (six) hours as needed for moderate pain.     [provider]  cholecalciferol (VITAMIN D) 400 units TABS tablet Take 400 Units by mouth daily.  [provider]  CRESTOR 40 MG tablet Take 40 mg by mouth every evening.  06/17/14   [provider]  estradiol (ESTRACE) 2 MG tablet Take 1 mg by mouth daily.  06/09/14   [provider]  lisinopril (PRINIVIL,ZESTRIL) 5 MG tablet Take 5 mg by mouth at bedtime.  06/17/14   [provider]  Omega-3 1000 MG CAPS Take 1,000 mg by mouth daily.    [provider]    SYNTHROID 150 MCG tablet Take 150 mcg by mouth daily.  06/30/14   [provider]  venlafaxine XR (EFFEXOR-XR) 37.5 MG 24 hr capsule Take 1 capsule by mouth daily. 07/02/14   [provider]    Family History Family History  Problem Relation Age of Onset  . Lung cancer Mother   . Hypertension Mother   . Breast cancer Maternal Grandmother 78  . Breast cancer Cousin   . Ovarian cancer Neg Hx     Social History Social History   Tobacco Use  . Smoking status: Never Smoker  . Smokeless tobacco: Never Used  Substance Use Topics  . Alcohol use: Yes    Comment: 08/03/2016 "might have 1 drink/year;  if that"  . Drug use: No     Allergies   Codeine   Review of Systems Review of Systems  Constitutional: Negative for diaphoresis, fever and malaise/fatigue.  Respiratory: Negative for cough, hemoptysis, sputum production and shortness of breath.   Cardiovascular: Positive for chest pain and palpitations. Negative for orthopnea, claudication, syncope, PND and near-syncope.  Gastrointestinal: Negative for abdominal pain, nausea and vomiting.  Musculoskeletal: Negative for back pain.  Neurological: Positive for numbness. Negative for dizziness, seizures, weakness and headaches.     Physical Exam Updated Vital Signs BP 139/63   Pulse 72   Temp 98 F (36.7 C) (Oral)   Resp 13   SpO2 98%   Physical Exam Constitutional:      Appearance: She is well-developed. She is not ill-appearing or diaphoretic.  HENT:     Head: Normocephalic and atraumatic.  Eyes:     Extraocular Movements: Extraocular movements intact.     Pupils: Pupils are equal, round, and reactive to light.  Neck:     Musculoskeletal: Normal range of motion and neck supple.  Cardiovascular:     Rate and Rhythm: Normal rate and regular rhythm.     Heart sounds: Normal heart sounds.  Pulmonary:     Effort: Pulmonary effort is normal. No tachypnea or respiratory distress.     Breath sounds: Normal  breath sounds. No decreased breath sounds.  Chest:     Chest wall: No mass or crepitus.  Abdominal:     General: Bowel sounds are normal.     Palpations: Abdomen is soft. There is no mass.  Musculoskeletal: Normal range of motion.     Right lower leg: No edema.     Left lower leg: No edema.  Skin:    General: Skin is warm and dry.  Neurological:     General: No focal deficit present.     Mental Status: She is alert.     Motor: No weakness.  Psychiatric:        Mood and Affect: Mood normal.        Behavior: Behavior normal.      ED Treatments / Results  Labs (all labs ordered are listed, but only abnormal results are displayed) Labs Reviewed  BASIC METABOLIC PANEL - Abnormal; Notable for the following components:  Result Value   Potassium 3.3 (*)    Glucose, Bld 149 (*)    All other components within normal limits  CBC  I-STAT TROPONIN, ED    EKG None  Radiology Dg Chest 2 View  Result Date: 05/11/2018 CLINICAL DATA:  Chest pressure EXAM: CHEST - 2 VIEW COMPARISON:  08/03/2016 FINDINGS: Mild cardiomegaly. No focal consolidation or effusion. No pneumothorax. Degenerative changes of the spine. IMPRESSION: No active cardiopulmonary disease.  Stable cardiomegaly. Electronically Signed   By: Donavan Foil M.D.   On: 05/11/2018 23:51    Procedures Procedures (including critical care time)  Medications Ordered in ED Medications  nitroGLYCERIN (NITROSTAT) SL tablet 0.4 mg (has no administration in time range)     Initial Impression / Assessment and Plan / ED Course  I have reviewed the triage vital signs and the nursing notes.  Pertinent labs & imaging results that were available during my care of the patient were reviewed by me and considered in my medical decision making (see chart for details).     Patient presenting with new onset chest pain.  She has had chest like this before in the past due to having too much Synthroid dose.  Patient heart score is 4  based on history age and risk factors alone.  Although work-up thus far with EKG and troponin negative, does not warrant hospitalization for observation due to heart score pathway.  Final Clinical Impressions(s) / ED Diagnoses   Final diagnoses:  Chest pain, unspecified type    ED Discharge Orders    None       Bonnita Hollow, MD 05/12/18 [place], [place], DO 05/12/18 0403

## 2018-05-12 NOTE — Consult Note (Signed)
Cardiology Consultation:   Patient ID: Michelle Kent MRN: [id number]; DOB: Aug 21, 1955  Admit date: 05/11/2018 Date of Consult: 05/12/2018  Primary Care Provider: Carol Ada, MD Primary Cardiologist: [facility] Primary Electrophysiologist:  None     Patient Profile:   Michelle Kent is a 62 y.o. female with a hx of HTN, HLD and syncope  who is being seen today for the evaluation of chest pain  at the request of Dr Ouida Sills .  History of Present Illness:   Michelle Kent 62 y.o. from [place]. Seen by Dr Debara Pickett 2018 after syncopal episode Negative cardiac w/u. Myovue 08/10/17 normal EF 79%  Has had labile BP and HLD followed by primary Non smoker. History of hypothyroidism on replacement. Last night after driving 20 minutes had left neck and shoulder pain. Sharp radiated to chest. Associated with palpitations and fatigue. In ER no arrhythmia on telemetry. She has r/o and no acute ECG changes. Pain free now. Pain not pleuritic Not positional. No associated cough fever sputum CXR NAD. Compliant with meds. Last myovue was done with chemical but she indicates she can walk   Past Medical History:  Diagnosis Date  . Basal cell carcinoma    back; chest; ankle/lower leg; face  . Heart murmur   . HLD (hyperlipidemia)   . Hypertension   . Hypothyroidism   . Syncope 08/03/2016    Past Surgical History:  Procedure Laterality Date  . ABDOMINAL HYSTERECTOMY  1993  . APPENDECTOMY  1976  . BACK SURGERY    . BASAL CELL CARCINOMA EXCISION Right 06/2010   scapula  . BASAL CELL CARCINOMA EXCISION Right 07/2016   chest  . Echo  . LAMINECTOMY AND MICRODISCECTOMY THORACIC SPINE Left 10/2007   Left sided T11-12 transthoracic microdiskectomy/notes 09/20/2010  . MOHS SURGERY Right 2012; 2017   ankle/lower leg; face  . TUBAL LIGATION       Home Medications:  Prior to Admission medications   Medication Sig Start Date End Date Taking? Authorizing Provider  acetaminophen (TYLENOL) 500 MG tablet  Take 1,000 mg by mouth every 6 (six) hours as needed for moderate pain.    Yes [provider]  aspirin EC 81 MG tablet Take 81 mg by mouth daily.   Yes [provider]  cholecalciferol (VITAMIN D) 400 units TABS tablet Take 400 Units by mouth daily.   Yes [provider]  CRESTOR 40 MG tablet Take 40 mg by mouth every evening.  06/17/14  Yes [provider]  lisinopril (PRINIVIL,ZESTRIL) 5 MG tablet Take 5 mg by mouth at bedtime.  06/17/14  Yes [provider]  Omega-3 1000 MG CAPS Take 1,000 mg by mouth daily.   Yes [provider]  SYNTHROID 150 MCG tablet Take 150 mcg by mouth daily.  06/30/14  Yes [provider]  venlafaxine XR (EFFEXOR-XR) 37.5 MG 24 hr capsule Take 1 capsule by mouth daily. 07/02/14  Yes [provider]  vitamin C (ASCORBIC ACID) 500 MG tablet Take 1,000 mg by mouth daily.   Yes [provider]    Inpatient Medications: Scheduled Meds: . aspirin EC  325 mg Oral Daily  . cyclobenzaprine  5 mg Oral Once  . enoxaparin (LOVENOX) injection  40 mg Subcutaneous Q24H  . levothyroxine  150 mcg Oral Q0600  . lisinopril  5 mg Oral QHS  . rosuvastatin  40 mg Oral QPM  . venlafaxine XR  37.5 mg Oral Daily   Continuous Infusions:  PRN Meds: acetaminophen, nitroGLYCERIN, nitroGLYCERIN,  ondansetron (ZOFRAN) IV  Allergies:    Allergies  Allergen Reactions  . Codeine Nausea And Vomiting    Social History:   Social History   Socioeconomic History  . Marital status: Married    Spouse name: Not on file  . Number of children: Not on file  . Years of education: Not on file  . Highest education level: Not on file  Occupational History  . Not on file  Social Needs  . Financial resource strain: Not on file  . Food insecurity:    Worry: Not on file    Inability: Not on file  . Transportation needs:    Medical: Not on file    Non-medical: Not on file  Tobacco Use  . Smoking status: Never  Smoker  . Smokeless tobacco: Never Used  Substance and Sexual Activity  . Alcohol use: Yes    Comment: 08/03/2016 "might have 1 drink/year;  if that"  . Drug use: No  . Sexual activity: Yes  Lifestyle  . Physical activity:    Days per week: Not on file    Minutes per session: Not on file  . Stress: Not on file  Relationships  . Social connections:    Talks on phone: Not on file    Gets together: Not on file    Attends religious service: Not on file    Active member of club or organization: Not on file    Attends meetings of clubs or organizations: Not on file    Relationship status: Not on file  . Intimate partner violence:    Fear of current or ex partner: Not on file    Emotionally abused: Not on file    Physically abused: Not on file    Forced sexual activity: Not on file  Other Topics Concern  . Not on file  Social History Narrative  . Not on file    Family History:    Family History  Problem Relation Age of Onset  . Lung cancer Mother   . Hypertension Mother   . Breast cancer Maternal Grandmother 78  . Breast cancer Cousin   . Ovarian cancer Neg Hx      ROS:  Please see the history of present illness.   All other ROS reviewed and negative.     Physical Exam/Data:   Vitals:   05/12/18 1215 05/12/18 1230 05/12/18 1300 05/12/18 1430  BP: 116/62 125/65 126/66 137/72  Pulse:  64 64 74  Resp: 19 14 14 16   Temp:    98.3 F (36.8 C)  TempSrc:    Oral  SpO2:  97% 96% 97%  Weight:    74.1 kg  Height:    5' 2.5" (1.588 m)   No intake or output data in the 24 hours ending 05/12/18 1533 Filed Weights   05/12/18 1430  Weight: 74.1 kg   Body mass index is 29.39 kg/m.  General:  Well nourished, well developed, in no acute distress  HEENT: normal Lymph: no adenopathy Neck: no JVD Endocrine:  No thryomegaly Vascular: No carotid bruits; FA pulses 2+ bilaterally without bruits  Cardiac:  normal S1, S2; RRR; no murmur   Lungs:  clear to auscultation  bilaterally, no wheezing, rhonchi or rales  Abd: soft, nontender, no hepatomegaly  Ext: no edema Musculoskeletal:  No deformities, BUE and BLE strength normal and equal Skin: warm and dry  Neuro:  CNs 2-12 intact, no focal abnormalities noted Psych:  Normal affect   EKG:  NSR  no acute changes  Telemetry:  Telemetry was personally reviewed and demonstrates:  NSR no significant arrhythmias   Relevant CV Studies: Myovue 08/10/16 normal EF 79%  Laboratory Data:  Chemistry Recent Labs  Lab 05/11/18 2325 05/12/18 0520  NA 141 140  K 3.3* 3.6  CL 103 104  CO2 26 25  GLUCOSE 149* 125*  BUN 14 18  CREATININE 0.71 0.70  CALCIUM 9.5 9.2  GFRNONAA >60 >60  GFRAA >60 >60  ANIONGAP 12 11    No results for input(s): PROT, ALBUMIN, AST, ALT, ALKPHOS, BILITOT in the last 168 hours. Hematology Recent Labs  Lab 05/11/18 2325 05/12/18 0520  WBC 6.0 5.9  RBC 4.28 4.17  HGB 13.6 12.7  HCT 39.4 38.5  MCV 92.1 92.3  MCH 31.8 30.5  MCHC 34.5 33.0  RDW 11.9 11.7  PLT 220 199   Cardiac Enzymes Recent Labs  Lab 05/12/18 0244 05/12/18 0520 05/12/18 0846  TROPONINI <0.03 <0.03 <0.03    Recent Labs  Lab 05/11/18 2342  TROPIPOC 0.00    BNPNo results for input(s): BNP, PROBNP in the last 168 hours.  DDimer No results for input(s): DDIMER in the last 168 hours.  Radiology/Studies:  Dg Chest 2 View  Result Date: 05/11/2018 CLINICAL DATA:  Chest pressure EXAM: CHEST - 2 VIEW COMPARISON:  08/03/2016 FINDINGS: Mild cardiomegaly. No focal consolidation or effusion. No pneumothorax. Degenerative changes of the spine. IMPRESSION: No active cardiopulmonary disease.  Stable cardiomegaly. Electronically Signed   By: Donavan Foil M.D.   On: 05/11/2018 23:51    Assessment and Plan:   1. Chest Pain: CRF;s HLD, HTN atypical r/o no acute ECG changes normal myovue 2018 Will order exercise myovue for am to further risk stratify Prefer exercise can be converted to [place] if she does not walk  far enough 2. Palpitations resolved no arrhythmia since in hospital 3. HTN:  Well controlled.  Continue current medications and low sodium Dash type diet.   4. HLD:  Continue statin labs with primary  5. Thyroid:  On replacement will repeat TSH since she had palpitations Last T4 normal 08/04/16 in Epic   For questions or updates, please contact [place] Please consult [email or web address] for contact info under     Signed, Jenkins Rouge, MD  05/12/2018 3:33 PM

## 2018-05-12 NOTE — Progress Notes (Addendum)
Report received from ED. Per report CP was suspected musculoskeletal, and non-cardiac in nature.  05/12/18  1430  Pt reports feeling of palpitations intermittently but not chest pain

## 2018-05-12 NOTE — ED Notes (Signed)
Pt ambulated to bathroom with steady gait and had standby assistance.

## 2018-05-12 NOTE — Progress Notes (Signed)
FPTS Interim Progress Note  S: patient states that she is still having intermittent left sided chest pain radiating to left shoulder blade and palpitations that are not associated with activity. She has had palpitations before when her thyroid hormone was too high and medication needed adjustment. She has been asymptomatic for 2 years. She states she did not sleep last night and would like something to help her sleep.   O: BP 126/66   Pulse 64   Temp 98 F (36.7 C) (Oral)   Resp 14   SpO2 96% General: no apparent distress, no increased WOB Extremities: no LE edema  A/P: Consulted cardiology who agreed to see the patient - plan for Myoview tomorrow. - NPO at midnight - appreciate cards recommendations  Prescribed topical nitro paste PRN Prescribed 1 dose flexeril for muscle relaxant and sleep aid.  Richarda Osmond, DO 05/12/2018, 3:11 PM PGY-1, [facility] pager [phone]

## 2018-05-13 ENCOUNTER — Observation Stay (HOSPITAL_BASED_OUTPATIENT_CLINIC_OR_DEPARTMENT_OTHER): Payer: BLUE CROSS/BLUE SHIELD

## 2018-05-13 ENCOUNTER — Other Ambulatory Visit: Payer: Self-pay

## 2018-05-13 DIAGNOSIS — R079 Chest pain, unspecified: Secondary | ICD-10-CM

## 2018-05-13 DIAGNOSIS — E039 Hypothyroidism, unspecified: Secondary | ICD-10-CM | POA: Diagnosis not present

## 2018-05-13 DIAGNOSIS — I10 Essential (primary) hypertension: Secondary | ICD-10-CM | POA: Diagnosis not present

## 2018-05-13 DIAGNOSIS — F419 Anxiety disorder, unspecified: Secondary | ICD-10-CM | POA: Diagnosis not present

## 2018-05-13 DIAGNOSIS — E876 Hypokalemia: Secondary | ICD-10-CM | POA: Diagnosis not present

## 2018-05-13 DIAGNOSIS — E785 Hyperlipidemia, unspecified: Secondary | ICD-10-CM | POA: Diagnosis not present

## 2018-05-13 DIAGNOSIS — R072 Precordial pain: Secondary | ICD-10-CM | POA: Diagnosis not present

## 2018-05-13 LAB — NM MYOCAR MULTI W/SPECT W/WALL MOTION / EF
Estimated workload: 1 METS
Exercise duration (min): 5 min
LV dias vol: 67 mL (ref 46–106)
LV sys vol: 18 mL
MPHR: 157 {beats}/min
Peak HR: 113 {beats}/min
Percent HR: 71 %
Rest HR: 71 {beats}/min
TID: 1.02

## 2018-05-13 LAB — TSH: TSH: 2.583 u[IU]/mL (ref 0.350–4.500)

## 2018-05-13 MED ORDER — REGADENOSON 0.4 MG/5ML IV SOLN
0.4000 mg | Freq: Once | INTRAVENOUS | Status: AC
  Administered 2018-05-13: 0.4 mg via INTRAVENOUS
  Filled 2018-05-13: qty 5

## 2018-05-13 MED ORDER — TECHNETIUM TC 99M TETROFOSMIN IV KIT
30.0000 | PACK | Freq: Once | INTRAVENOUS | Status: AC | PRN
  Administered 2018-05-13: 30 via INTRAVENOUS

## 2018-05-13 MED ORDER — REGADENOSON 0.4 MG/5ML IV SOLN
INTRAVENOUS | Status: AC
  Filled 2018-05-13: qty 5

## 2018-05-13 MED ORDER — TECHNETIUM TC 99M TETROFOSMIN IV KIT
10.0000 | PACK | Freq: Once | INTRAVENOUS | Status: AC | PRN
  Administered 2018-05-13: 10 via INTRAVENOUS

## 2018-05-13 NOTE — Progress Notes (Signed)
Pt has orders to be discharged. Discharge instructions given and pt has no additional questions at this time. Medication regimen reviewed and pt educated. Pt verbalized understanding and has no additional questions. Telemetry box removed. IV removed and site in good condition. Pt stable and waiting for transportation. 

## 2018-05-13 NOTE — Discharge Summary (Signed)
[facility] Discharge Summary  Patient name: Michelle Kent Medical record number: [id number] Date of birth: Sep 27, 1955 Age: 62 y.o. Gender: female Date of Admission: 05/11/2018  Date of Discharge: 12/23 Admitting Physician: Alveda Reasons, MD  Primary Care Provider: Carol Ada, MD Consultants: cardiology  Indication for Hospitalization: ACS r/o  Discharge Diagnoses/Problem List:  Chest pain- non coronary in nature Anxiety Hypothyroidism Hypokalemia HTN HLD  Disposition: discharge home  Discharge Condition: resolved  Discharge Exam:  General: NAD, pleasant, able to participate in exam Cardiac: RRR, normal heart sounds, no murmurs. 2+ radial and PT pulses bilaterally Respiratory: CTAB, normal effort, No wheezes, rales or rhonchi Abdomen: soft, nontender, nondistended, no hepatic or splenomegaly, +BS Extremities: no edema or cyanosis. WWP. Skin: warm and dry, no rashes noted Neuro: alert and oriented x4, no focal deficits Psych: Normal affect and mood  Brief Hospital Course:  Patient admitted for palpitations and chest pain while driving. Cardiology was consulted.  Chest pain- patient had ACS workup which revealed negative serial troponins, EKG negative for ischemic changes, and myoview stress test negative for ischemic changes. Cardiology was consulted given risk factors of HLD, HTN, history of chest pain. Her chest pain was mildly improved with nitroglycerin administration and resolved spontaneously on same day of admission. Her vital signs remained stable throughout admission. She was able to tolerate a normal diet and ambulation. TSH was checked and normal given patient's similar presentation 08/2016 which revealed hyperthyroid 2/2 medication dosage incorrect.  Issues for Follow Up:  1. Follow up for better anxiety control 2. Management of CAD risk factors  Significant Procedures: nuclear myoview scan  Significant Labs and Imaging:  Recent  Labs  Lab 05/11/18 2325 05/12/18 0520  WBC 6.0 5.9  HGB 13.6 12.7  HCT 39.4 38.5  PLT 220 199   Recent Labs  Lab 05/11/18 2325 05/12/18 0520  NA 141 140  K 3.3* 3.6  CL 103 104  CO2 26 25  GLUCOSE 149* 125*  BUN 14 18  CREATININE 0.71 0.70  CALCIUM 9.5 9.2    Troponin (Point of Care Test) Recent Labs    05/11/18 2342  TROPIPOC 0.00     Results/Tests Pending at Time of Discharge: none  Discharge Medications:  Allergies as of 05/13/2018      Reactions   Codeine Nausea And Vomiting      Medication List    TAKE these medications   acetaminophen 500 MG tablet Commonly known as:  TYLENOL Take 1,000 mg by mouth every 6 (six) hours as needed for moderate pain.   aspirin EC 81 MG tablet Take 81 mg by mouth daily.   cholecalciferol 10 MCG (400 UNIT) Tabs tablet Commonly known as:  VITAMIN D3 Take 400 Units by mouth daily.   CRESTOR 40 MG tablet Generic drug:  rosuvastatin Take 40 mg by mouth every evening.   lisinopril 5 MG tablet Commonly known as:  PRINIVIL,ZESTRIL Take 5 mg by mouth at bedtime.   Omega-3 1000 MG Caps Take 1,000 mg by mouth daily.   SYNTHROID 150 MCG tablet Generic drug:  levothyroxine Take 150 mcg by mouth daily.   venlafaxine XR 37.5 MG 24 hr capsule Commonly known as:  EFFEXOR-XR Take 1 capsule by mouth daily.   vitamin C 500 MG tablet Commonly known as:  ASCORBIC ACID Take 1,000 mg by mouth daily.       Discharge Instructions: Please refer to Patient Instructions section of EMR for full details.  Patient was counseled important signs  and symptoms that should prompt return to medical care, changes in medications, dietary instructions, activity restrictions, and follow up appointments.   Follow-Up Appointments:   Richarda Osmond, DO 05/13/2018, 9:31 AM PGY-1, [place]

## 2018-10-30 ENCOUNTER — Other Ambulatory Visit: Payer: Self-pay | Admitting: Family Medicine

## 2018-10-30 DIAGNOSIS — Z9289 Personal history of other medical treatment: Secondary | ICD-10-CM

## 2018-11-19 ENCOUNTER — Encounter (HOSPITAL_BASED_OUTPATIENT_CLINIC_OR_DEPARTMENT_OTHER): Payer: Self-pay | Admitting: *Deleted

## 2018-11-19 ENCOUNTER — Other Ambulatory Visit: Payer: Self-pay

## 2018-11-19 ENCOUNTER — Emergency Department (HOSPITAL_BASED_OUTPATIENT_CLINIC_OR_DEPARTMENT_OTHER)
Admission: EM | Admit: 2018-11-19 | Discharge: 2018-11-19 | Disposition: A | Discharge status: Home / Self Care | Payer: BC Managed Care – PPO | Attending: Emergency Medicine | Admitting: Emergency Medicine

## 2018-11-19 DIAGNOSIS — Z79899 Other long term (current) drug therapy: Secondary | ICD-10-CM | POA: Diagnosis not present

## 2018-11-19 DIAGNOSIS — S3992XA Unspecified injury of lower back, initial encounter: Secondary | ICD-10-CM | POA: Diagnosis present

## 2018-11-19 DIAGNOSIS — Y929 Unspecified place or not applicable: Secondary | ICD-10-CM | POA: Insufficient documentation

## 2018-11-19 DIAGNOSIS — I10 Essential (primary) hypertension: Secondary | ICD-10-CM | POA: Diagnosis not present

## 2018-11-19 DIAGNOSIS — E039 Hypothyroidism, unspecified: Secondary | ICD-10-CM | POA: Insufficient documentation

## 2018-11-19 DIAGNOSIS — Y999 Unspecified external cause status: Secondary | ICD-10-CM | POA: Diagnosis not present

## 2018-11-19 DIAGNOSIS — S29012A Strain of muscle and tendon of back wall of thorax, initial encounter: Secondary | ICD-10-CM | POA: Insufficient documentation

## 2018-11-19 DIAGNOSIS — X58XXXA Exposure to other specified factors, initial encounter: Secondary | ICD-10-CM | POA: Insufficient documentation

## 2018-11-19 DIAGNOSIS — C4491 Basal cell carcinoma of skin, unspecified: Secondary | ICD-10-CM | POA: Insufficient documentation

## 2018-11-19 DIAGNOSIS — Z7982 Long term (current) use of aspirin: Secondary | ICD-10-CM | POA: Diagnosis not present

## 2018-11-19 DIAGNOSIS — Y939 Activity, unspecified: Secondary | ICD-10-CM | POA: Insufficient documentation

## 2018-11-19 LAB — URINALYSIS, ROUTINE W REFLEX MICROSCOPIC
Bilirubin Urine: NEGATIVE
Glucose, UA: NEGATIVE mg/dL
Hgb urine dipstick: NEGATIVE
Ketones, ur: NEGATIVE mg/dL
Nitrite: NEGATIVE
Protein, ur: NEGATIVE mg/dL
Specific Gravity, Urine: 1.01 (ref 1.005–1.030)
pH: 6 (ref 5.0–8.0)

## 2018-11-19 LAB — CBC WITH DIFFERENTIAL/PLATELET
Abs Immature Granulocytes: 0.02 10*3/uL (ref 0.00–0.07)
Basophils Absolute: 0 10*3/uL (ref 0.0–0.1)
Basophils Relative: 0 %
Eosinophils Absolute: 0.1 10*3/uL (ref 0.0–0.5)
Eosinophils Relative: 2 %
HCT: 41.1 % (ref 36.0–46.0)
Hemoglobin: 13.7 g/dL (ref 12.0–15.0)
Immature Granulocytes: 0 %
Lymphocytes Relative: 37 %
Lymphs Abs: 1.9 10*3/uL (ref 0.7–4.0)
MCH: 31.2 pg (ref 26.0–34.0)
MCHC: 33.3 g/dL (ref 30.0–36.0)
MCV: 93.6 fL (ref 80.0–100.0)
Monocytes Absolute: 0.4 10*3/uL (ref 0.1–1.0)
Monocytes Relative: 8 %
Neutro Abs: 2.8 10*3/uL (ref 1.7–7.7)
Neutrophils Relative %: 53 %
Platelets: 217 10*3/uL (ref 150–400)
RBC: 4.39 MIL/uL (ref 3.87–5.11)
RDW: 11.9 % (ref 11.5–15.5)
WBC: 5.3 10*3/uL (ref 4.0–10.5)
nRBC: 0 % (ref 0.0–0.2)

## 2018-11-19 LAB — COMPREHENSIVE METABOLIC PANEL
ALT: 36 U/L (ref 0–44)
AST: 36 U/L (ref 15–41)
Albumin: 4.6 g/dL (ref 3.5–5.0)
Alkaline Phosphatase: 65 U/L (ref 38–126)
Anion gap: 12 (ref 5–15)
BUN: 12 mg/dL (ref 8–23)
CO2: 27 mmol/L (ref 22–32)
Calcium: 9.1 mg/dL (ref 8.9–10.3)
Chloride: 101 mmol/L (ref 98–111)
Creatinine, Ser: 0.66 mg/dL (ref 0.44–1.00)
GFR calc Af Amer: >60 mL/min (ref >60)
GFR calc non Af Amer: >60 mL/min (ref >60)
Glucose, Bld: 116 mg/dL — ABNORMAL HIGH (ref 70–99)
Potassium: 3.7 mmol/L (ref 3.5–5.1)
Sodium: 140 mmol/L (ref 135–145)
Total Bilirubin: 0.9 mg/dL (ref 0.3–1.2)
Total Protein: 7.3 g/dL (ref 6.5–8.1)

## 2018-11-19 LAB — URINALYSIS, MICROSCOPIC (REFLEX)

## 2018-11-19 MED ORDER — KETOROLAC TROMETHAMINE 30 MG/ML IJ SOLN
30.0000 mg | Freq: Once | INTRAMUSCULAR | Status: AC
  Administered 2018-11-19: 12:00:00 30 mg via INTRAVENOUS
  Filled 2018-11-19: qty 1

## 2018-11-19 MED ORDER — METHOCARBAMOL 500 MG PO TABS: 500.0000 mg | ORAL_TABLET | Freq: Four times a day (QID) | ORAL | 0 refills | Status: DC | PRN

## 2018-11-19 MED ORDER — HYDROCODONE-ACETAMINOPHEN 5-325 MG PO TABS: 1.0000 | ORAL_TABLET | Freq: Four times a day (QID) | ORAL | 0 refills | Status: DC | PRN

## 2018-11-19 MED ORDER — METHYLPREDNISOLONE 4 MG PO TBPK: ORAL_TABLET | ORAL | 0 refills | Status: DC

## 2018-11-19 MED ORDER — METHOCARBAMOL 1000 MG/10ML IJ SOLN
500.0000 mg | Freq: Once | INTRAMUSCULAR | Status: AC
  Administered 2018-11-19: 500 mg via INTRAVENOUS
  Filled 2018-11-19: qty 10

## 2018-11-19 MED ORDER — HYDROCODONE-ACETAMINOPHEN 5-325 MG PO TABS
1.0000 | ORAL_TABLET | Freq: Once | ORAL | Status: AC
  Administered 2018-11-19: 1 via ORAL
  Filled 2018-11-19: qty 1

## 2018-11-19 NOTE — Discharge Instructions (Signed)
1.  Start Medrol Dosepak today as prescribed.  Take Robaxin for a muscle relaxer and you may add 1-2 hydrocodone tablets if needed for additional pain control. 2.  Follow-up with your doctor this week for recheck. 3.  You may rest but try to continue moving and doing light stretching exercises as soon as possible to avoid increasing stiffness and immobility. 4.  Return to the emergency department if you develop weakness or numbness to your leg, fever chills new or other concerning symptoms.

## 2018-11-19 NOTE — ED Provider Notes (Signed)
[facility] EMERGENCY DEPARTMENT Provider Note   CSN: [id number] Arrival date & time: 11/19/18  1017    History   Chief Complaint Chief Complaint  Patient presents with  . Back Pain    HPI Michelle Kent is a 63 y.o. female.     HPI Patient reports she is having severe pain in her right lower back.  She indicates an area just slightly above the SI crest.  She reports is intense when she tries to move twist or walk.  Radiates slightly around towards her lateral back.  Does not radiate down the leg.  No weakness of the leg no numbness no tingling.  No abdominal pain, nausea vomiting or urinary symptoms.  No chest pain no cough no shortness of breath.  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 Past Medical History:  Diagnosis Date  . Basal cell carcinoma    back; chest; ankle/lower leg; face  . Heart murmur   . HLD (hyperlipidemia)   . Hypertension   . Hypothyroidism   . Syncope 08/03/2016    Patient Active Problem List   Diagnosis Date Noted  . HLD (hyperlipidemia)   . Hypothyroidism   . Chest pain 08/03/2016  . Sinusitis 08/03/2016  . HTN (hypertension) 08/03/2016    Past Surgical History:  Procedure Laterality Date  . ABDOMINAL HYSTERECTOMY  1993  . APPENDECTOMY  1976  . BACK SURGERY    . BASAL CELL CARCINOMA EXCISION Right 06/2010   scapula  . BASAL CELL CARCINOMA EXCISION Right 07/2016   chest  . [place]  . LAMINECTOMY AND MICRODISCECTOMY THORACIC SPINE Left 10/2007   Left sided T11-12 transthoracic microdiskectomy/notes 09/20/2010  . MOHS SURGERY Right 2012; 2017   ankle/lower leg; face  . TUBAL LIGATION       OB History   No obstetric history on file.      Home Medications    Prior to Admission medications   Medication Sig Start Date End Date Taking? Authorizing Provider  acetaminophen (TYLENOL) 500 MG tablet Take 1,000 mg by mouth every 6 (six) hours as needed for moderate pain.     [provider]  aspirin EC 81 MG tablet Take 81 mg by mouth daily.    [provider]  cholecalciferol (VITAMIN D) 400 units TABS tablet Take 400 Units by mouth daily.    [provider]  CRESTOR 40 MG tablet Take 40 mg by mouth every evening.  06/17/14   [provider]  HYDROcodone-acetaminophen (NORCO/VICODIN) 5-325 MG tablet Take 1-2 tablets by mouth every 6 (six) hours as needed for moderate pain or severe pain. 11/19/18   Charlesetta Shanks, MD  lisinopril (PRINIVIL,ZESTRIL) 5 MG tablet Take 5 mg by mouth at bedtime.  06/17/14   [provider]  methocarbamol (ROBAXIN) 500 MG tablet Take 1 tablet (500 mg total) by mouth every 6 (six) hours as needed for muscle spasms. 11/19/18   Charlesetta Shanks, MD  methylPREDNISolone (MEDROL DOSEPAK) 4 MG TBPK tablet As per pack in structions 11/19/18   Charlesetta Shanks, MD  Omega-3 1000 MG CAPS Take 1,000 mg by mouth daily.    [provider]  SYNTHROID 150 MCG tablet Take 150 mcg by mouth daily.  06/30/14   [provider]  venlafaxine XR (EFFEXOR-XR) 37.5 MG 24 hr capsule Take 1 capsule by mouth daily. 07/02/14   [provider]  vitamin C (ASCORBIC ACID) 500 MG tablet Take 1,000 mg by mouth  daily.    [provider]    Family History Family History  Problem Relation Age of Onset  . Lung cancer Mother   . Hypertension Mother   . Breast cancer Maternal Grandmother 78  . Breast cancer Cousin   . Ovarian cancer Neg Hx     Social History Social History   Tobacco Use  . Smoking status: Never Smoker  . Smokeless tobacco: Never Used  Substance Use Topics  . Alcohol use: Yes    Comment: 08/03/2016 "might have 1 drink/year;  if that"  . Drug use: No     Allergies   Codeine   Review of Systems Review of Systems 10 Systems reviewed and are negative for acute change except as noted in the HPI.   Physical Exam Updated Vital Signs BP (!) 141/70 (BP Location: Right Arm)   Pulse 66    Temp (!) 97.5 F (36.4 C) (Oral)   Resp 16   Ht 5\' 3"  (1.6 m)   Wt 73.5 kg   SpO2 97%   BMI 28.70 kg/m   Physical Exam Constitutional:      Comments: Patient is alert and nontoxic.  She does appear to be in pain.  No respiratory distress.  Clear mental status.  HENT:     Head: Normocephalic and atraumatic.  Eyes:     Extraocular Movements: Extraocular movements intact.     Pupils: Pupils are equal, round, and reactive to light.  Neck:     Musculoskeletal: Neck supple.  Cardiovascular:     Rate and Rhythm: Normal rate and regular rhythm.  Pulmonary:     Effort: Pulmonary effort is normal.     Breath sounds: Normal breath sounds.  Abdominal:     General: There is no distension.     Palpations: Abdomen is soft.     Tenderness: There is no abdominal tenderness. There is no guarding.  Musculoskeletal:     Comments: Normal visual inspection of the back.  No erythema no rashes no soft tissue abnormality.  Does endorse tenderness to palpation on the right side at the area starting at the paraspinous muscle bodies around the mid thoracic spine and then wrapping slightly laterally down towards about the crest of the iliac.  This area causes a lancinating pain when she tries to roll onto her side or move.  Normal neurovascular exam of the lower extremities.  Calves are soft and nontender.  There is no peripheral edema.  2+ and symmetric dorsalis pedis pulses.  Skin:    General: Skin is warm.  Neurological:     General: No focal deficit present.     Mental Status: She is oriented to person, place, and time.     Coordination: Coordination normal.     Comments: Sensation intact to light touch and symmetric of the lower extremities.  Dorsiflexion and plantar flexion strength normal bilateral lower extremities.  Patient can hold and elevate legs against resistance.  Psychiatric:        Mood and Affect: Mood normal.      ED Treatments / Results  Labs (all labs ordered are listed, but  only abnormal results are displayed) Labs Reviewed  URINALYSIS, ROUTINE W REFLEX MICROSCOPIC - Abnormal; Notable for the following components:      Result Value   Leukocytes,Ua TRACE (*)    All other components within normal limits  COMPREHENSIVE METABOLIC PANEL - Abnormal; Notable for the following components:   Glucose, Bld 116 (*)    All other  components within normal limits  URINALYSIS, MICROSCOPIC (REFLEX) - Abnormal; Notable for the following components:   Bacteria, UA FEW (*)    All other components within normal limits  CBC WITH DIFFERENTIAL/PLATELET    EKG None  Radiology No results found.  Procedures Procedures (including critical care time)  Medications Ordered in ED Medications  ketorolac (TORADOL) 30 MG/ML injection 30 mg (30 mg Intravenous Given 11/19/18 1210)  methocarbamol (ROBAXIN) injection 500 mg (500 mg Intravenous Given 11/19/18 1206)  HYDROcodone-acetaminophen (NORCO/VICODIN) 5-325 MG per tablet 1 tablet (1 tablet Oral Given 11/19/18 1333)     Initial Impression / Assessment and Plan / ED Course  I have reviewed the triage vital signs and the nursing notes.  Pertinent labs & imaging results that were available during my care of the patient were reviewed by me and considered in my medical decision making (see chart for details).       Patient's pain is very focal on the thoracic back.  This is very reproducible with any twisting motions and palpation.  No neurologic dysfunction and no intra-abdominal complaints or findings.  This time consistent with musculoskeletal thoracic pain.  Will treat for pain with return precautions and follow-up plan in place.  Final Clinical Impressions(s) / ED Diagnoses   Final diagnoses:  Strain of thoracic back region    ED Discharge Orders         Ordered    methylPREDNISolone (MEDROL DOSEPAK) 4 MG TBPK tablet     11/19/18 1332    methocarbamol (ROBAXIN) 500 MG tablet  Every 6 hours PRN     11/19/18 1332     HYDROcodone-acetaminophen (NORCO/VICODIN) 5-325 MG tablet  Every 6 hours PRN     11/19/18 1332           Charlesetta Shanks, MD 11/19/18 1335

## 2018-11-19 NOTE — ED Triage Notes (Signed)
C/o back and rt side pain onset this am , nausea at time, denies blood in urine, no pain w urination

## 2018-12-13 ENCOUNTER — Ambulatory Visit: Payer: BLUE CROSS/BLUE SHIELD

## 2019-02-07 ENCOUNTER — Ambulatory Visit
Admission: RE | Admit: 2019-02-07 | Discharge: 2019-02-07 | Disposition: A | Discharge status: Home / Self Care | Payer: BC Managed Care – PPO | Source: Ambulatory Visit | Attending: Family Medicine | Admitting: Family Medicine

## 2019-02-07 ENCOUNTER — Other Ambulatory Visit: Payer: Self-pay

## 2019-02-07 DIAGNOSIS — Z9289 Personal history of other medical treatment: Secondary | ICD-10-CM

## 2019-08-02 ENCOUNTER — Ambulatory Visit: Payer: BC Managed Care – PPO | Attending: Internal Medicine

## 2019-08-02 DIAGNOSIS — Z23 Encounter for immunization: Secondary | ICD-10-CM

## 2019-08-02 NOTE — Progress Notes (Signed)
   Covid-19 Vaccination Clinic  Name:  Michelle Kent    MRN: [id number] DOB: 17-Oct-1955  08/02/2019  [name] was observed post Covid-19 immunization for 15 minutes without incident. She was provided with Vaccine Information Sheet and instruction to access the V-Safe system.   [name] was instructed to call 911 with any severe reactions post vaccine: Marland Kitchen Difficulty breathing  . Swelling of face and throat  . A fast heartbeat  . A bad rash all over body  . Dizziness and weakness   Immunizations Administered    Name Date Dose VIS Date Route   Pfizer COVID-19 Vaccine 08/02/2019  8:20 AM 0.3 mL 05/02/2019 Intramuscular   Manufacturer: [place]   Lot: [id number]   [place]: [id number]

## 2019-08-25 ENCOUNTER — Ambulatory Visit: Payer: BC Managed Care – PPO

## 2019-08-25 ENCOUNTER — Ambulatory Visit: Payer: BC Managed Care – PPO | Attending: Internal Medicine

## 2019-08-25 DIAGNOSIS — Z23 Encounter for immunization: Secondary | ICD-10-CM

## 2019-08-25 NOTE — Progress Notes (Signed)
   Covid-19 Vaccination Clinic  Name:  Michelle Kent    MRN: [id number] DOB: January 10, 1956  08/25/2019  Michelle Kent was observed post Covid-19 immunization for 15 minutes without incident. She was provided with Vaccine Information Sheet and instruction to access the V-Safe system.   Michelle Kent was instructed to call 911 with any severe reactions post vaccine: Marland Kitchen Difficulty breathing  . Swelling of face and throat  . A fast heartbeat  . A bad rash all over body  . Dizziness and weakness   Immunizations Administered    Name Date Dose VIS Date Route   Pfizer COVID-19 Vaccine 08/25/2019 11:28 AM 0.3 mL 05/02/2019 Intramuscular   Manufacturer: [place]   Lot: [id number]   [place]: [id number]

## 2020-03-05 ENCOUNTER — Other Ambulatory Visit: Payer: Self-pay | Admitting: Family Medicine

## 2020-03-05 DIAGNOSIS — Z1231 Encounter for screening mammogram for malignant neoplasm of breast: Secondary | ICD-10-CM

## 2020-03-08 ENCOUNTER — Other Ambulatory Visit: Payer: Self-pay

## 2020-03-08 ENCOUNTER — Ambulatory Visit
Admission: RE | Admit: 2020-03-08 | Discharge: 2020-03-08 | Disposition: A | Discharge status: Home / Self Care | Payer: Medicare Other | Source: Ambulatory Visit | Attending: Family Medicine | Admitting: Family Medicine

## 2020-03-08 DIAGNOSIS — Z1231 Encounter for screening mammogram for malignant neoplasm of breast: Secondary | ICD-10-CM

## 2020-03-20 ENCOUNTER — Ambulatory Visit: Payer: Medicare Other | Attending: Internal Medicine

## 2020-03-20 DIAGNOSIS — Z23 Encounter for immunization: Secondary | ICD-10-CM

## 2020-03-20 NOTE — Progress Notes (Signed)
   Covid-19 Vaccination Clinic  Name:  Michelle Kent    MRN: [id number] DOB: 26-Nov-1955  03/20/2020  Michelle Kent was observed post Covid-19 immunization for 15 minutes without incident. She was provided with Vaccine Information Sheet and instruction to access the V-Safe system.   Michelle Kent was instructed to call 911 with any severe reactions post vaccine: Marland Kitchen Difficulty breathing  . Swelling of face and throat  . A fast heartbeat  . A bad rash all over body  . Dizziness and weakness

## 2020-06-01 DIAGNOSIS — R002 Palpitations: Secondary | ICD-10-CM | POA: Diagnosis not present

## 2020-06-10 NOTE — Progress Notes (Signed)
Cardiology Office Note:   Date:  06/11/2020  NAME:  Michelle Kent    MRN: [id number] DOB:  [birth year]   PCP:  Carol Ada, MD  Cardiologist:  No primary care provider on file.  Electrophysiologist:  None   Referring MD: Carol Ada, MD   Chief Complaint  Patient presents with  . Palpitations   History of Present Illness:   Michelle Kent is a 65 y.o. female with a hx of HTN, HLD who is being seen today for the evaluation of tachycardia at the request of Carol Ada, MD.  She reports roughly 2 weeks ago she had an episode of tachycardia.  She reports she felt her heart racing on a Sunday.  She works as a Scientist, water quality at [employer].  She was at work.  She reports her heart race for up to 30 minutes and then stop.  She reports she got a little dizzy but did not pass out.  No shortness of breath or chest pain reported.  Her medical history significant for hypertension, hyperlipidemia and hypothyroidism.  Most recent TSH 1.26.  She is not had any chest pain.  No further episodes of palpitations.  She is never had this before.  Her EKG in office demonstrates normal sinus rhythm with old anteroseptal infarct.  She has had 2 normal stress test in 2018 and 2019.  No symptoms of chest pain.  She is a never smoker.  She does not drink alcohol or use drugs.  She reports she was drinking up to 3 cups of coffee per day.  She has reduced this.  She is also reduced her soda intake.  She is also drinking more water.  Symptoms have not returned.  No family history of heart disease.  Most recent cholesterol profile shows an LDL 38 triglycerides 314.  This needs to be rechecked.  She does take Crestor.  BP 140/42.  No murmurs on examination.  Problem List 1. HTN 2. HLD -Total cholesterol 142, HDL 58, LDL 38, triglycerides 314 3.  Hypothyroidism  Past Medical History: Past Medical History:  Diagnosis Date  . Basal cell carcinoma    back; chest; ankle/lower leg; face  . Heart murmur   . HLD (hyperlipidemia)   .  Hypertension   . Hypothyroidism   . Syncope 08/03/2016    Past Surgical History: Past Surgical History:  Procedure Laterality Date  . ABDOMINAL HYSTERECTOMY  1993  . APPENDECTOMY  1976  . BACK SURGERY    . BASAL CELL CARCINOMA EXCISION Right 06/2010   scapula  . BASAL CELL CARCINOMA EXCISION Right 07/2016   chest  . [place]  . LAMINECTOMY AND MICRODISCECTOMY THORACIC SPINE Left 10/2007   Left sided T11-12 transthoracic microdiskectomy/notes 09/20/2010  . MOHS SURGERY Right 2012; 2017   ankle/lower leg; face  . TUBAL LIGATION      Current Medications: Current Meds  Medication Sig  . acetaminophen (TYLENOL) 500 MG tablet Take 1,000 mg by mouth every 6 (six) hours as needed for moderate pain.   Marland Kitchen aspirin EC 81 MG tablet Take 81 mg by mouth daily.  . cholecalciferol (VITAMIN D) 400 units TABS tablet Take 400 Units by mouth daily.  . CRESTOR 40 MG tablet Take 40 mg by mouth every evening.   Marland Kitchen Desvenlafaxine ER (PRISTIQ) 50 MG TB24 Take 1 tablet by mouth daily.  Marland Kitchen HYDROcodone-acetaminophen (NORCO/VICODIN) 5-325 MG tablet Take 1-2 tablets by mouth every 6 (six) hours as needed for moderate pain or severe pain.  Marland Kitchen  levothyroxine (SYNTHROID) 112 MCG tablet Take 112 mcg by mouth daily.  Marland Kitchen lisinopril (PRINIVIL,ZESTRIL) 5 MG tablet Take 5 mg by mouth at bedtime.   . methocarbamol (ROBAXIN) 500 MG tablet Take 1 tablet (500 mg total) by mouth every 6 (six) hours as needed for muscle spasms.  . methylPREDNISolone (MEDROL DOSEPAK) 4 MG TBPK tablet As per pack in structions  . Omega-3 1000 MG CAPS Take 1,000 mg by mouth daily.  Marland Kitchen SYNTHROID 150 MCG tablet Take 150 mcg by mouth daily.   Marland Kitchen venlafaxine XR (EFFEXOR-XR) 37.5 MG 24 hr capsule Take 1 capsule by mouth daily.  . vitamin C (ASCORBIC ACID) 500 MG tablet Take 1,000 mg by mouth daily.     Allergies:    Codeine   Social History: Social History   Socioeconomic History  . Marital status: Married    Spouse name: Not on  file  . Number of children: 2  . Years of education: Not on file  . Highest education level: Not on file  Occupational History  . Not on file  Tobacco Use  . Smoking status: Never Smoker  . Smokeless tobacco: Never Used  Substance and Sexual Activity  . Alcohol use: Yes    Comment: 08/03/2016 "might have 1 drink/year;  if that"  . Drug use: No  . Sexual activity: Yes  Other Topics Concern  . Not on file  Social History Narrative  . Not on file   Social Determinants of Health   Financial Resource Strain: Not on file  Food Insecurity: Not on file  Transportation Needs: Not on file  Physical Activity: Not on file  Stress: Not on file  Social Connections: Not on file     Family History: The patient's family history includes Breast cancer in her cousin; Breast cancer (age of onset: 27) in her maternal grandmother; Hypertension in her mother; Lung cancer in her mother. There is no history of Ovarian cancer.  ROS:   All other ROS reviewed and negative. Pertinent positives noted in the HPI.     EKGs/Labs/Other Studies Reviewed:   The following studies were personally reviewed by me today:  EKG:  EKG is ordered today.  The ekg ordered today demonstrates normal sinus rhythm, old anteroseptal infarct, heart rate 70, normal intervals, and was personally reviewed by me.   NM Stress 08/10/2016 -> normal NM Stress 05/13/2018 ->normal   Recent Labs: No results found for requested labs within last 8760 hours.   Recent Lipid Panel    Component Value Date/Time   CHOL 104 08/04/2016 0512   TRIG 134 08/04/2016 0512   HDL 49 08/04/2016 0512   CHOLHDL 2.1 08/04/2016 0512   VLDL 27 08/04/2016 0512   LDLCALC 28 08/04/2016 0512    Physical Exam:   VS:  BP (!) 140/50   Pulse 70   Ht 5\' 3"  (1.6 m)   Wt 168 lb 12.8 oz (76.6 kg)   BMI 29.90 kg/m    Wt Readings from Last 3 Encounters:  06/11/20 168 lb 12.8 oz (76.6 kg)  11/19/18 162 lb (73.5 kg)  05/13/18 164 lb 11.2 oz (74.7 kg)     General: Well nourished, well developed, in no acute distress Head: Atraumatic, normal size  Eyes: PEERLA, EOMI  Neck: Supple, no JVD Endocrine: No thryomegaly Cardiac: Normal S1, S2; RRR; no murmurs, rubs, or gallops Lungs: Clear to auscultation bilaterally, no wheezing, rhonchi or rales  Abd: Soft, nontender, no hepatomegaly  Ext: No edema, pulses 2+ Musculoskeletal: No  deformities, BUE and BLE strength normal and equal Skin: Warm and dry, no rashes   Neuro: Alert and oriented to person, place, time, and situation, CNII-XII grossly intact, no focal deficits  Psych: Normal mood and affect   ASSESSMENT:   Tacia Hindley is a 65 y.o. female who presents for the following: 1. Tachycardia   2. Palpitations   3. Nonspecific abnormal electrocardiogram (ECG) (EKG)     PLAN:   1. Tachycardia 2. Palpitations 3. Nonspecific abnormal electrocardiogram (ECG) (EKG) -One-time episode of palpitations that occurred for roughly 30 minutes 2 weeks ago.  No further episodes.  Recent TSH is normal 1.26.  She has had 2 normal stress test in 2018 and 2019.  No symptoms of angina. -Overall she could have had an arrhythmia.  No further episodes.  I have instructed her to let me know if she has further episodes.  We could pursue a monitor.  I see no need for monitor at this time.  We also discussed the cardia mobile.  This may be a better way for her to monitor her heart rhythm at home. -Symptoms have improved with caffeine reduction as well as increased oral hydration.  I have instructed her to drink at least 100 ounces of water per day. -EKG does demonstrate normal sinus rhythm with an old anteroseptal infarct.  I suspect this is all breast artifact.  I would like for her to obtain an echocardiogram.  She has had normal stress testing in the past.  No symptoms of angina.  Disposition: Return if symptoms worsen or fail to improve.  Medication Adjustments/Labs and Tests Ordered: Current medicines are  reviewed at length with the patient today.  Concerns regarding medicines are outlined above.  Orders Placed This Encounter  Procedures  . EKG 12-Lead  . ECHOCARDIOGRAM COMPLETE   No orders of the defined types were placed in this encounter.   Patient Instructions  Medication Instructions:  The current medical regimen is effective;  continue present plan and medications.  *If you need a refill on your cardiac medications before your next appointment, please call your pharmacy*  Testing/Procedures: Echocardiogram - Your physician has requested that you have an echocardiogram. Echocardiography is a painless test that uses sound waves to create images of your heart. It provides your doctor with information about the size and shape of your heart and how well your heart's chambers and valves are working. This procedure takes approximately one hour. There are no restrictions for this procedure. This will be performed at our [facility] location - [address], [address].    Follow-Up: At [facility], you and your health needs are our priority.  As part of our continuing mission to provide you with exceptional heart care, we have created designated Provider Care Teams.  These Care Teams include your primary Cardiologist (physician) and Advanced Practice Providers (APPs -  Physician Assistants and Nurse Practitioners) who all work together to provide you with the care you need, when you need it.  We recommend signing up for the patient portal called "MyChart".  Sign up information is provided on this After Visit Summary.  MyChart is used to connect with patients for Virtual Visits (Telemedicine).  Patients are able to view lab/test results, encounter notes, upcoming appointments, etc.  Non-urgent messages can be sent to your provider as well.   To learn more about what you can do with MyChart, go to [email or web address].    Your next appointment:   As needed  The  format for your next  appointment:   In Person  Provider:   Eleonore Chiquito, MD        Signed, Addison Naegeli. Audie Box, [place]  [address] [place], [place] [address] [phone]  06/11/2020 2:05 PM

## 2020-06-11 ENCOUNTER — Encounter: Payer: Self-pay | Admitting: Cardiovascular Disease

## 2020-06-11 ENCOUNTER — Ambulatory Visit: Payer: Medicare Other | Admitting: Cardiovascular Disease

## 2020-06-11 ENCOUNTER — Other Ambulatory Visit: Payer: Self-pay

## 2020-06-11 VITALS — BP 140/50 | HR 70 | Ht 63.0 in | Wt 168.8 lb

## 2020-06-11 DIAGNOSIS — R002 Palpitations: Secondary | ICD-10-CM

## 2020-06-11 DIAGNOSIS — R000 Tachycardia, unspecified: Secondary | ICD-10-CM | POA: Diagnosis not present

## 2020-06-11 DIAGNOSIS — R9431 Abnormal electrocardiogram [ECG] [EKG]: Secondary | ICD-10-CM | POA: Diagnosis not present

## 2020-06-11 NOTE — Patient Instructions (Signed)
Medication Instructions:  The current medical regimen is effective;  continue present plan and medications.  *If you need a refill on your cardiac medications before your next appointment, please call your pharmacy*   Testing/Procedures: Echocardiogram - Your physician has requested that you have an echocardiogram. Echocardiography is a painless test that uses sound waves to create images of your heart. It provides your doctor with information about the size and shape of your heart and how well your heart's chambers and valves are working. This procedure takes approximately one hour. There are no restrictions for this procedure. This will be performed at our [address] location - [address], [address].    Follow-Up: At [facility], you and your health needs are our priority.  As part of our continuing mission to provide you with exceptional heart care, we have created designated Provider Care Teams.  These Care Teams include your primary Cardiologist (physician) and Advanced Practice Providers (APPs -  Physician Assistants and Nurse Practitioners) who all work together to provide you with the care you need, when you need it.  We recommend signing up for the patient portal called "MyChart".  Sign up information is provided on this After Visit Summary.  MyChart is used to connect with patients for Virtual Visits (Telemedicine).  Patients are able to view lab/test results, encounter notes, upcoming appointments, etc.  Non-urgent messages can be sent to your provider as well.   To learn more about what you can do with MyChart, go to [email or web address].    Your next appointment:   As needed  The format for your next appointment:   In Person  Provider:   P[place]hurst O'Neal, MD      

## 2020-07-12 ENCOUNTER — Other Ambulatory Visit (HOSPITAL_COMMUNITY): Payer: Medicare Other

## 2020-07-15 DIAGNOSIS — J40 Bronchitis, not specified as acute or chronic: Secondary | ICD-10-CM | POA: Diagnosis not present

## 2020-08-03 ENCOUNTER — Telehealth (HOSPITAL_COMMUNITY): Payer: Self-pay | Admitting: Cardiovascular Disease

## 2020-08-03 NOTE — Telephone Encounter (Signed)
08/03/20 Pt cancelled echocardiogram VIA automated system and I called  To confirm and she was unable to rescheduled at this time and she will call me back. Order will be removed from the [place] and if pt calls back to reschedule we will reinstate the order.

## 2020-08-05 ENCOUNTER — Other Ambulatory Visit (HOSPITAL_COMMUNITY): Payer: Medicare Other

## 2020-09-30 DIAGNOSIS — Z1389 Encounter for screening for other disorder: Secondary | ICD-10-CM | POA: Diagnosis not present

## 2020-09-30 DIAGNOSIS — E782 Mixed hyperlipidemia: Secondary | ICD-10-CM | POA: Diagnosis not present

## 2020-09-30 DIAGNOSIS — I10 Essential (primary) hypertension: Secondary | ICD-10-CM | POA: Diagnosis not present

## 2020-09-30 DIAGNOSIS — Z1211 Encounter for screening for malignant neoplasm of colon: Secondary | ICD-10-CM | POA: Diagnosis not present

## 2020-09-30 DIAGNOSIS — E039 Hypothyroidism, unspecified: Secondary | ICD-10-CM | POA: Diagnosis not present

## 2020-09-30 DIAGNOSIS — Z23 Encounter for immunization: Secondary | ICD-10-CM | POA: Diagnosis not present

## 2020-09-30 DIAGNOSIS — Z0000 Encounter for general adult medical examination without abnormal findings: Secondary | ICD-10-CM | POA: Diagnosis not present

## 2020-09-30 DIAGNOSIS — N3281 Overactive bladder: Secondary | ICD-10-CM | POA: Diagnosis not present

## 2020-10-25 ENCOUNTER — Ambulatory Visit
Admission: EM | Admit: 2020-10-25 | Discharge: 2020-10-25 | Disposition: A | Discharge status: Home / Self Care | Payer: Medicare Other | Attending: Emergency Medicine | Admitting: Emergency Medicine

## 2020-10-25 ENCOUNTER — Other Ambulatory Visit: Payer: Self-pay

## 2020-10-25 DIAGNOSIS — R0789 Other chest pain: Secondary | ICD-10-CM | POA: Diagnosis not present

## 2020-10-25 MED ORDER — NAPROXEN 500 MG PO TABS: 500.0000 mg | ORAL_TABLET | Freq: Two times a day (BID) | ORAL | 0 refills | Status: DC

## 2020-10-25 MED ORDER — TIZANIDINE HCL 2 MG PO TABS
2.0000 mg | ORAL_TABLET | Freq: Four times a day (QID) | ORAL | 0 refills | Status: AC | PRN
Start: 2020-10-25 — End: ?

## 2020-10-25 NOTE — Discharge Instructions (Signed)
EKG normal Naprosyn twice daily with food Supplement tizanidine at home/bedtime for muscle spasming-may cause drowsiness, do not drive or work after taking If chest pain worsening, developing dizziness lightheadedness nausea vomiting or left-sided pain please go to the emergency room for further evaluation of your heart

## 2020-10-25 NOTE — ED Triage Notes (Signed)
Pt presents today for sharp intermittent chest pains that started while she was working. Pt describes the pain as "almost like a muscle spasm".  Denies palpitation and sob. Pain is not radiating.

## 2020-10-25 NOTE — ED Provider Notes (Signed)
[facility]    CSN: [id number] Arrival date & time: 10/25/20  1256      History   Chief Complaint Chief Complaint  Patient presents with  . Chest Pain    HPI Michelle Kent is a 65 y.o. female history of hypertension presenting today for evaluation of chest pain.  Reports at work earlier today began to develop a spasming sensation in her right chest.  Denies any associated left-sided chest discomfort palpitations or shortness of breath.  Denies radiation to back.  Denies radiation into extremities.  Denies any nausea or vomiting.  Did feel slightly warm.  Denies history of diabetes, tobacco use.  Does report history of hypertension, but controlled.  Denies any unilateral leg pain or leg swelling.  Denies history of DVT/PE.  Denies recent cough or fevers.  HPI  Past Medical History:  Diagnosis Date  . Basal cell carcinoma    back; chest; ankle/lower leg; face  . Heart murmur   . HLD (hyperlipidemia)   . Hypertension   . Hypothyroidism   . Syncope 08/03/2016    Patient Active Problem List   Diagnosis Date Noted  . HLD (hyperlipidemia)   . Hypothyroidism   . Chest pain 08/03/2016  . Sinusitis 08/03/2016  . HTN (hypertension) 08/03/2016    Past Surgical History:  Procedure Laterality Date  . ABDOMINAL HYSTERECTOMY  1993  . APPENDECTOMY  1976  . BACK SURGERY    . BASAL CELL CARCINOMA EXCISION Right 06/2010   scapula  . BASAL CELL CARCINOMA EXCISION Right 07/2016   chest  . [place]  . LAMINECTOMY AND MICRODISCECTOMY THORACIC SPINE Left 10/2007   Left sided T11-12 transthoracic microdiskectomy/notes 09/20/2010  . MOHS SURGERY Right 2012; 2017   ankle/lower leg; face  . TUBAL LIGATION      OB History   No obstetric history on file.      Home Medications    Prior to Admission medications   Medication Sig Start Date End Date Taking? Authorizing Provider  naproxen (NAPROSYN) 500 MG tablet Take 1 tablet (500 mg total) by mouth 2 (two) times  daily. 10/25/20  Yes Montavis Schubring C, PA-C  tiZANidine (ZANAFLEX) 2 MG tablet Take 1-2 tablets (2-4 mg total) by mouth every 6 (six) hours as needed for muscle spasms. 10/25/20  Yes Leyanna Bittman C, PA-C  acetaminophen (TYLENOL) 500 MG tablet Take 1,000 mg by mouth every 6 (six) hours as needed for moderate pain.     [provider]  aspirin EC 81 MG tablet Take 81 mg by mouth daily.    [provider]  cholecalciferol (VITAMIN D) 400 units TABS tablet Take 400 Units by mouth daily.    [provider]  CRESTOR 40 MG tablet Take 40 mg by mouth every evening.  06/17/14   [provider]  Desvenlafaxine ER (PRISTIQ) 50 MG TB24 Take 1 tablet by mouth daily. 02/24/20   [provider]  HYDROcodone-acetaminophen (NORCO/VICODIN) 5-325 MG tablet Take 1-2 tablets by mouth every 6 (six) hours as needed for moderate pain or severe pain. 11/19/18   Charlesetta Shanks, MD  levothyroxine (SYNTHROID) 112 MCG tablet Take 112 mcg by mouth daily. 06/05/20   [provider]  lisinopril (PRINIVIL,ZESTRIL) 5 MG tablet Take 5 mg by mouth at bedtime.  06/17/14   [provider]  methylPREDNISolone (MEDROL DOSEPAK) 4 MG TBPK tablet As per pack in structions 11/19/18   Charlesetta Shanks, MD  Omega-3 1000 MG CAPS Take 1,000 mg by  mouth daily.    [provider]  SYNTHROID 150 MCG tablet Take 150 mcg by mouth daily.  06/30/14   [provider]  venlafaxine XR (EFFEXOR-XR) 37.5 MG 24 hr capsule Take 1 capsule by mouth daily. 07/02/14   [provider]  vitamin C (ASCORBIC ACID) 500 MG tablet Take 1,000 mg by mouth daily.    [provider]    Family History Family History  Problem Relation Age of Onset  . Lung cancer Mother   . Hypertension Mother   . Breast cancer Maternal Grandmother 78  . Breast cancer Cousin   . Ovarian cancer Neg Hx     Social History Social History   Tobacco Use  . Smoking status: Never Smoker  . Smokeless  tobacco: Never Used  Substance Use Topics  . Alcohol use: Yes    Comment: 08/03/2016 "might have 1 drink/year;  if that"  . Drug use: No     Allergies   Nystatin and Codeine   Review of Systems Review of Systems  Constitutional: Negative for fatigue and fever.  HENT: Negative for congestion, sinus pressure and sore throat.   Eyes: Negative for photophobia, pain and visual disturbance.  Respiratory: Negative for cough and shortness of breath.   Cardiovascular: Positive for chest pain. Negative for leg swelling.  Gastrointestinal: Negative for abdominal pain, nausea and vomiting.  Genitourinary: Negative for decreased urine volume and hematuria.  Musculoskeletal: Negative for myalgias, neck pain and neck stiffness.  Neurological: Negative for dizziness, syncope, facial asymmetry, speech difficulty, weakness, light-headedness, numbness and headaches.     Physical Exam Triage Vital Signs ED Triage Vitals  Enc Vitals Group     BP 10/25/20 1317 (!) 143/73     Pulse Rate 10/25/20 1317 74     Resp 10/25/20 1317 18     Temp 10/25/20 1317 98 F (36.7 C)     Temp Source 10/25/20 1317 Oral     SpO2 10/25/20 1317 94 %     Weight --      Height --      Head Circumference --      Peak Flow --      Pain Score 10/25/20 1319 7     Pain Loc --      Pain Edu? --      Excl. in [place]? --    No data found.  Updated Vital Signs BP (!) 143/73 (BP Location: Right Arm)   Pulse 74   Temp 98 F (36.7 C) (Oral)   Resp 18   SpO2 94%   Visual Acuity Right Eye Distance:   Left Eye Distance:   Bilateral Distance:    Right Eye Near:   Left Eye Near:    Bilateral Near:     Physical Exam Vitals and nursing note reviewed.  Constitutional:      Appearance: She is well-developed.     Comments: No acute distress  HENT:     Head: Normocephalic and atraumatic.     Nose: Nose normal.  Eyes:     Conjunctiva/sclera: Conjunctivae normal.  Cardiovascular:     Rate and Rhythm: Normal rate and  regular rhythm.  Pulmonary:     Effort: Pulmonary effort is normal. No respiratory distress.     Comments: Breathing comfortably at rest, CTABL, no wheezing, rales or other adventitious sounds auscultated Abdominal:     General: There is no distension.  Musculoskeletal:        General: Normal range of motion.  Cervical back: Neck supple.     Comments: Right anterior chest with mild reproducible tenderness to palpation in the area where patient has been feeling spasming  Skin:    General: Skin is warm and dry.  Neurological:     Mental Status: She is alert and oriented to person, place, and time.      UC Treatments / Results  Labs (all labs ordered are listed, but only abnormal results are displayed) Labs Reviewed - No data to display  EKG   Radiology No results found.  Procedures Procedures (including critical care time)  Medications Ordered in UC Medications - No data to display  Initial Impression / Assessment and Plan / UC Course  I have reviewed the triage vital signs and the nursing notes.  Pertinent labs & imaging results that were available during my care of the patient were reviewed by me and considered in my medical decision making (see chart for details).     EKG normal sinus rhythm, no acute signs of ischemia or infarction, mild reproducible tenderness as well as atypical presentation of right-sided spasming sensation.  Suspect likely MSK etiology and recommend anti-inflammatories and muscle relaxers.  Discussed with patient unable to 100% rule out cardiac etiology with EKG alone, symptoms progressing or worsening to follow-up in emergency room.  Discussed strict return precautions. Patient verbalized understanding and is agreeable with plan.  Final Clinical Impressions(s) / UC Diagnoses   Final diagnoses:  Atypical chest pain     Discharge Instructions     EKG normal Naprosyn twice daily with food Supplement tizanidine at home/bedtime for muscle  spasming-may cause drowsiness, do not drive or work after taking If chest pain worsening, developing dizziness lightheadedness nausea vomiting or left-sided pain please go to the emergency room for further evaluation of your heart    ED Prescriptions    Medication Sig Dispense Auth. Provider   naproxen (NAPROSYN) 500 MG tablet Take 1 tablet (500 mg total) by mouth 2 (two) times daily. 30 tablet Hattye Siegfried C, PA-C   tiZANidine (ZANAFLEX) 2 MG tablet Take 1-2 tablets (2-4 mg total) by mouth every 6 (six) hours as needed for muscle spasms. 30 tablet Jahlia Omura, [place] C, PA-C     PDMP not reviewed this encounter.   Janith Lima, [place] 10/25/20 1423

## 2020-11-10 DIAGNOSIS — L259 Unspecified contact dermatitis, unspecified cause: Secondary | ICD-10-CM | POA: Diagnosis not present

## 2020-11-10 DIAGNOSIS — R21 Rash and other nonspecific skin eruption: Secondary | ICD-10-CM | POA: Diagnosis not present

## 2020-12-21 DIAGNOSIS — H25043 Posterior subcapsular polar age-related cataract, bilateral: Secondary | ICD-10-CM | POA: Diagnosis not present

## 2020-12-21 DIAGNOSIS — H25013 Cortical age-related cataract, bilateral: Secondary | ICD-10-CM | POA: Diagnosis not present

## 2020-12-21 DIAGNOSIS — H2513 Age-related nuclear cataract, bilateral: Secondary | ICD-10-CM | POA: Diagnosis not present

## 2020-12-21 DIAGNOSIS — H2511 Age-related nuclear cataract, right eye: Secondary | ICD-10-CM | POA: Diagnosis not present

## 2020-12-21 DIAGNOSIS — H18413 Arcus senilis, bilateral: Secondary | ICD-10-CM | POA: Diagnosis not present

## 2021-04-16 DIAGNOSIS — R058 Other specified cough: Secondary | ICD-10-CM | POA: Diagnosis not present

## 2021-04-19 DIAGNOSIS — R058 Other specified cough: Secondary | ICD-10-CM | POA: Diagnosis not present

## 2021-06-03 ENCOUNTER — Other Ambulatory Visit: Payer: Self-pay | Admitting: Family Medicine

## 2021-06-03 DIAGNOSIS — Z1231 Encounter for screening mammogram for malignant neoplasm of breast: Secondary | ICD-10-CM

## 2021-06-23 ENCOUNTER — Ambulatory Visit: Payer: Medicare Other

## 2021-07-08 ENCOUNTER — Ambulatory Visit
Admission: RE | Admit: 2021-07-08 | Discharge: 2021-07-08 | Disposition: A | Discharge status: Home / Self Care | Payer: Medicare Other | Source: Ambulatory Visit | Attending: Family Medicine | Admitting: Family Medicine

## 2021-07-08 DIAGNOSIS — Z1231 Encounter for screening mammogram for malignant neoplasm of breast: Secondary | ICD-10-CM | POA: Diagnosis not present

## 2021-07-19 DIAGNOSIS — M791 Myalgia, unspecified site: Secondary | ICD-10-CM | POA: Diagnosis not present

## 2021-07-19 DIAGNOSIS — I10 Essential (primary) hypertension: Secondary | ICD-10-CM | POA: Diagnosis not present

## 2021-08-04 DIAGNOSIS — R946 Abnormal results of thyroid function studies: Secondary | ICD-10-CM | POA: Diagnosis not present

## 2021-09-12 DIAGNOSIS — R140 Abdominal distension (gaseous): Secondary | ICD-10-CM | POA: Diagnosis not present

## 2021-09-12 DIAGNOSIS — E039 Hypothyroidism, unspecified: Secondary | ICD-10-CM | POA: Diagnosis not present

## 2021-09-12 DIAGNOSIS — R197 Diarrhea, unspecified: Secondary | ICD-10-CM | POA: Diagnosis not present

## 2021-10-31 DIAGNOSIS — E039 Hypothyroidism, unspecified: Secondary | ICD-10-CM | POA: Diagnosis not present

## 2021-12-15 DIAGNOSIS — E782 Mixed hyperlipidemia: Secondary | ICD-10-CM | POA: Diagnosis not present

## 2021-12-15 DIAGNOSIS — Z0000 Encounter for general adult medical examination without abnormal findings: Secondary | ICD-10-CM | POA: Diagnosis not present

## 2021-12-15 DIAGNOSIS — I10 Essential (primary) hypertension: Secondary | ICD-10-CM | POA: Diagnosis not present

## 2021-12-15 DIAGNOSIS — Z1211 Encounter for screening for malignant neoplasm of colon: Secondary | ICD-10-CM | POA: Diagnosis not present

## 2021-12-15 DIAGNOSIS — E039 Hypothyroidism, unspecified: Secondary | ICD-10-CM | POA: Diagnosis not present

## 2021-12-15 DIAGNOSIS — Z23 Encounter for immunization: Secondary | ICD-10-CM | POA: Diagnosis not present

## 2022-05-17 DIAGNOSIS — R051 Acute cough: Secondary | ICD-10-CM | POA: Diagnosis not present

## 2022-06-19 DIAGNOSIS — E039 Hypothyroidism, unspecified: Secondary | ICD-10-CM | POA: Diagnosis not present

## 2022-06-19 DIAGNOSIS — I10 Essential (primary) hypertension: Secondary | ICD-10-CM | POA: Diagnosis not present

## 2022-06-19 DIAGNOSIS — E782 Mixed hyperlipidemia: Secondary | ICD-10-CM | POA: Diagnosis not present

## 2022-07-13 ENCOUNTER — Emergency Department (HOSPITAL_COMMUNITY)
Admission: EM | Admit: 2022-07-13 | Discharge: 2022-07-13 | Disposition: A | Discharge status: Home / Self Care | Payer: Medicare Other | Attending: Emergency Medicine | Admitting: Emergency Medicine

## 2022-07-13 ENCOUNTER — Encounter (HOSPITAL_COMMUNITY): Payer: Self-pay | Admitting: Emergency Medicine

## 2022-07-13 ENCOUNTER — Emergency Department (HOSPITAL_COMMUNITY): Payer: Medicare Other

## 2022-07-13 ENCOUNTER — Other Ambulatory Visit: Payer: Self-pay

## 2022-07-13 DIAGNOSIS — Z7982 Long term (current) use of aspirin: Secondary | ICD-10-CM | POA: Insufficient documentation

## 2022-07-13 DIAGNOSIS — R079 Chest pain, unspecified: Secondary | ICD-10-CM | POA: Diagnosis not present

## 2022-07-13 DIAGNOSIS — R911 Solitary pulmonary nodule: Secondary | ICD-10-CM | POA: Diagnosis not present

## 2022-07-13 DIAGNOSIS — I10 Essential (primary) hypertension: Secondary | ICD-10-CM | POA: Diagnosis not present

## 2022-07-13 DIAGNOSIS — E1165 Type 2 diabetes mellitus with hyperglycemia: Secondary | ICD-10-CM | POA: Diagnosis not present

## 2022-07-13 DIAGNOSIS — Z79899 Other long term (current) drug therapy: Secondary | ICD-10-CM | POA: Diagnosis not present

## 2022-07-13 DIAGNOSIS — R739 Hyperglycemia, unspecified: Secondary | ICD-10-CM | POA: Diagnosis not present

## 2022-07-13 DIAGNOSIS — R531 Weakness: Secondary | ICD-10-CM | POA: Diagnosis not present

## 2022-07-13 DIAGNOSIS — I700 Atherosclerosis of aorta: Secondary | ICD-10-CM | POA: Diagnosis not present

## 2022-07-13 DIAGNOSIS — R0789 Other chest pain: Secondary | ICD-10-CM | POA: Diagnosis not present

## 2022-07-13 LAB — I-STAT CHEM 8, ED
BUN: 6 mg/dL — ABNORMAL LOW (ref 8–23)
Calcium, Ion: 1 mmol/L — ABNORMAL LOW (ref 1.15–1.40)
Chloride: 100 mmol/L (ref 98–111)
Creatinine, Ser: 0.6 mg/dL (ref 0.44–1.00)
Glucose, Bld: 180 mg/dL — ABNORMAL HIGH (ref 70–99)
HCT: 40 % (ref 36.0–46.0)
Hemoglobin: 13.6 g/dL (ref 12.0–15.0)
Potassium: 3.5 mmol/L (ref 3.5–5.1)
Sodium: 140 mmol/L (ref 135–145)
TCO2: 30 mmol/L (ref 22–32)

## 2022-07-13 LAB — CBC
HCT: 40.7 % (ref 36.0–46.0)
Hemoglobin: 13.9 g/dL (ref 12.0–15.0)
MCH: 31.6 pg (ref 26.0–34.0)
MCHC: 34.2 g/dL (ref 30.0–36.0)
MCV: 92.5 fL (ref 80.0–100.0)
Platelets: 202 10*3/uL (ref 150–400)
RBC: 4.4 MIL/uL (ref 3.87–5.11)
RDW: 11.3 % — ABNORMAL LOW (ref 11.5–15.5)
WBC: 4.9 10*3/uL (ref 4.0–10.5)
nRBC: 0 % (ref 0.0–0.2)

## 2022-07-13 LAB — BASIC METABOLIC PANEL
Anion gap: 8 (ref 5–15)
BUN: 6 mg/dL — ABNORMAL LOW (ref 8–23)
CO2: 28 mmol/L (ref 22–32)
Calcium: 8.8 mg/dL — ABNORMAL LOW (ref 8.9–10.3)
Chloride: 102 mmol/L (ref 98–111)
Creatinine, Ser: 0.68 mg/dL (ref 0.44–1.00)
GFR, Estimated: >60 mL/min (ref >60)
Glucose, Bld: 187 mg/dL — ABNORMAL HIGH (ref 70–99)
Potassium: 3.5 mmol/L (ref 3.5–5.1)
Sodium: 138 mmol/L (ref 135–145)

## 2022-07-13 LAB — TROPONIN I (HIGH SENSITIVITY)
Troponin I (High Sensitivity): 3 ng/L (ref <18)
Troponin I (High Sensitivity): 4 ng/L (ref <18)

## 2022-07-13 LAB — D-DIMER, QUANTITATIVE: D-Dimer, Quant: <0.27 ug/mL-FEU (ref 0.00–0.50)

## 2022-07-13 MED ORDER — PANTOPRAZOLE SODIUM 40 MG PO TBEC: 40.0000 mg | DELAYED_RELEASE_TABLET | Freq: Every day | ORAL | 0 refills | Status: AC

## 2022-07-13 MED ORDER — IOHEXOL 350 MG/ML SOLN
100.0000 mL | Freq: Once | INTRAVENOUS | Status: AC | PRN
  Administered 2022-07-13: 100 mL via INTRAVENOUS

## 2022-07-13 MED ORDER — ONDANSETRON HCL 4 MG/2ML IJ SOLN
4.0000 mg | Freq: Once | INTRAMUSCULAR | Status: AC
  Administered 2022-07-13: 4 mg via INTRAVENOUS
  Filled 2022-07-13: qty 2

## 2022-07-13 MED ORDER — MORPHINE SULFATE (PF) 4 MG/ML IV SOLN
4.0000 mg | Freq: Once | INTRAVENOUS | Status: AC
  Administered 2022-07-13: 4 mg via INTRAVENOUS
  Filled 2022-07-13: qty 1

## 2022-07-13 MED ORDER — MORPHINE SULFATE (PF) 4 MG/ML IV SOLN
4.0000 mg | Freq: Once | INTRAVENOUS | Status: AC
Start: 2022-07-13 — End: 2022-07-13
  Administered 2022-07-13: 4 mg via INTRAVENOUS
  Filled 2022-07-13: qty 1

## 2022-07-13 NOTE — ED Triage Notes (Signed)
Pt was driving her son to [place] and developed some chest pain heaviness, no n/v or sob   only hx is htn , pt received 1 nitro and 342m asa by ems

## 2022-07-13 NOTE — Discharge Instructions (Signed)
You were seen in the emergency Department for some sharp intermittent left-sided chest pain.  You had cardiac enzymes along with chest x-ray and a CAT scan of your chest.  There was no evidence of blood clot or heart attack.  Your blood sugar was elevated and this will need follow-up with your primary care doctor.  They also saw pulmonary nodule that will need follow-up.  We are starting you on some acid medication as a possible cause of your symptoms.  Please follow-up with your doctor and return to the emergency department if any worsening or concerning symptoms

## 2022-07-13 NOTE — ED Provider Triage Note (Signed)
Emergency Medicine Provider Triage Evaluation Note  Michelle Kent , a 67 y.o. female  was evaluated in triage.  Pt complains of chest pain that radiates through to her back.  She reports that this started acutely while she was driving her son.  Said that she then felt weakness in her legs and "just does not feel right."  History of hypertension.  Review of Systems  Positive:  Negative:   Physical Exam  There were no vitals taken for this visit. Gen:   Awake, no distress   Resp:  Normal effort  MSK:   Moves extremities without difficulty  Other:  Very subtle systolic heart murmur, lung sounds clear, rolling around in pain  Medical Decision Making  Medically screening exam initiated at 11:15 AM.  Appropriate orders placed.  Michelle Kent was informed that the remainder of the evaluation will be completed by another provider, this initial triage assessment does not replace that evaluation, and the importance of remaining in the ED until their evaluation is complete.    Nursing staff made aware that the patient needs the next room.     Michelle Hammock, PA-C 07/13/22 1119

## 2022-07-13 NOTE — ED Provider Notes (Signed)
[facility] Provider Note   CSN: [id number] Arrival date & time: 07/13/22  1106     History  Chief Complaint  Patient presents with   Chest Pain    Michelle Kent is a 67 y.o. female.  She is here with a complaint of chest pain.  She said it started with heart fluttering around 9:00 this morning while she was driving.  It turned into significant pressure in her upper chest left radiating through to the back.  She rates it as 10 out of 10.  She ultimately had to pull over and call 911.  They gave her aspirin and nitroglycerin without significant improvement.  She currently rates it as 8 out of 10 its intermittent.  She has never had this before.  No history of heart attack but she does show she has had a heart murmur and palpitations in the past.  She is a non-smoker.  The history is provided by the patient.  Chest Pain Pain location:  L chest Pain quality: pressure   Pain radiates to:  Upper back Pain severity:  Severe Onset quality:  Gradual Duration:  3 hours Timing:  Intermittent Progression:  Unchanged Chronicity:  New Relieved by:  Nothing Worsened by:  Nothing Ineffective treatments:  Aspirin and nitroglycerin Associated symptoms: back pain and nausea   Associated symptoms: no abdominal pain, no cough, no diaphoresis, no fever, no shortness of breath and no vomiting   Risk factors: high cholesterol and hypertension   Risk factors: no smoking        Home Medications Prior to Admission medications   Medication Sig Start Date End Date Taking? Authorizing Provider  acetaminophen (TYLENOL) 500 MG tablet Take 1,000 mg by mouth every 6 (six) hours as needed for moderate pain.     [provider]  aspirin EC 81 MG tablet Take 81 mg by mouth daily.    [provider]  cholecalciferol (VITAMIN D) 400 units TABS tablet Take 400 Units by mouth daily.    [provider]  CRESTOR 40 MG tablet Take 40 mg by mouth  every evening.  06/17/14   [provider]  Desvenlafaxine ER (PRISTIQ) 50 MG TB24 Take 1 tablet by mouth daily. 02/24/20   [provider]  HYDROcodone-acetaminophen (NORCO/VICODIN) 5-325 MG tablet Take 1-2 tablets by mouth every 6 (six) hours as needed for moderate pain or severe pain. 11/19/18   Charlesetta Shanks, MD  levothyroxine (SYNTHROID) 112 MCG tablet Take 112 mcg by mouth daily. 06/05/20   [provider]  lisinopril (PRINIVIL,ZESTRIL) 5 MG tablet Take 5 mg by mouth at bedtime.  06/17/14   [provider]  methylPREDNISolone (MEDROL DOSEPAK) 4 MG TBPK tablet As per pack in structions 11/19/18   Charlesetta Shanks, MD  naproxen (NAPROSYN) 500 MG tablet Take 1 tablet (500 mg total) by mouth 2 (two) times daily. 10/25/20   Wieters, Hallie C, PA-C  Omega-3 1000 MG CAPS Take 1,000 mg by mouth daily.    [provider]  SYNTHROID 150 MCG tablet Take 150 mcg by mouth daily.  06/30/14   [provider]  tiZANidine (ZANAFLEX) 2 MG tablet Take 1-2 tablets (2-4 mg total) by mouth every 6 (six) hours as needed for muscle spasms. 10/25/20   Wieters, Hallie C, PA-C  venlafaxine XR (EFFEXOR-XR) 37.5 MG 24 hr capsule Take 1 capsule by mouth daily. 07/02/14   [provider]  vitamin C (ASCORBIC ACID) 500 MG tablet Take 1,000  mg by mouth daily.    [provider]      Allergies    Nystatin and Codeine    Review of Systems   Review of Systems  Constitutional:  Negative for diaphoresis and fever.  Respiratory:  Negative for cough and shortness of breath.   Cardiovascular:  Positive for chest pain.  Gastrointestinal:  Positive for nausea. Negative for abdominal pain and vomiting.  Musculoskeletal:  Positive for back pain.    Physical Exam Updated Vital Signs BP (!) 149/57 (BP Location: Right Arm)   Pulse 88   Temp 98 F (36.7 C) (Oral)   Resp 16   Ht 5' 3"$  (1.6 m)   Wt 70.8 kg   SpO2 100%   BMI 27.63 kg/m  Physical Exam Vitals and  nursing note reviewed.  Constitutional:      General: She is not in acute distress.    Appearance: She is well-developed.  HENT:     Head: Normocephalic and atraumatic.  Eyes:     Conjunctiva/sclera: Conjunctivae normal.  Cardiovascular:     Rate and Rhythm: Normal rate and regular rhythm.     Pulses:          Radial pulses are 2+ on the right side and 2+ on the left side.       Dorsalis pedis pulses are 2+ on the right side and 2+ on the left side.     Heart sounds: Normal heart sounds. No murmur heard. Pulmonary:     Effort: Pulmonary effort is normal. No respiratory distress.     Breath sounds: Normal breath sounds.  Abdominal:     Palpations: Abdomen is soft.     Tenderness: There is no abdominal tenderness.  Musculoskeletal:        General: No swelling.     Cervical back: Neck supple.     Right lower leg: No tenderness. No edema.     Left lower leg: No tenderness. No edema.  Skin:    General: Skin is warm and dry.     Capillary Refill: Capillary refill takes less than 2 seconds.  Neurological:     General: No focal deficit present.     Mental Status: She is alert.     ED Results / Procedures / Treatments   Labs (all labs ordered are listed, but only abnormal results are displayed) Labs Reviewed  BASIC METABOLIC PANEL - Abnormal; Notable for the following components:      Result Value   Glucose, Bld 187 (*)    BUN 6 (*)    Calcium 8.8 (*)    All other components within normal limits  CBC - Abnormal; Notable for the following components:   RDW 11.3 (*)    All other components within normal limits  I-STAT CHEM 8, ED - Abnormal; Notable for the following components:   BUN 6 (*)    Glucose, Bld 180 (*)    Calcium, Ion 1.00 (*)    All other components within normal limits  D-DIMER, QUANTITATIVE  TROPONIN I (HIGH SENSITIVITY)  TROPONIN I (HIGH SENSITIVITY)    EKG EKG Interpretation  Date/Time:  Thursday July 13 2022 11:19:28 EST Ventricular Rate:  66 PR  Interval:  170 QRS Duration: 80 QT Interval:  388 QTC Calculation: 406 R Axis:   14 Text Interpretation: Normal sinus rhythm Possible Anteroseptal infarct , age undetermined Abnormal ECG When compared with ECG of 13-May-2018 04:45, No significant change since prior 12/19 Confirmed by Aletta Edouard [id number]) on  07/13/2022 11:55:46 AM  Radiology CT Angio Chest/Abd/Pel for Dissection W and/or W/WO  Result Date: 07/13/2022 CLINICAL DATA:  Chest pain and pressure concern for acute aortic syndrome. EXAM: CT ANGIOGRAPHY CHEST, ABDOMEN AND PELVIS TECHNIQUE: Non-contrast CT of the chest was initially obtained. Multidetector CT imaging through the chest, abdomen and pelvis was performed using the standard protocol during bolus administration of intravenous contrast. Multiplanar reconstructed images and MIPs were obtained and reviewed to evaluate the vascular anatomy. RADIATION DOSE REDUCTION: This exam was performed according to the departmental dose-optimization program which includes automated exposure control, adjustment of the mA and/or kV according to patient size and/or use of iterative reconstruction technique. CONTRAST:  146m OMNIPAQUE IOHEXOL 350 MG/ML SOLN COMPARISON:  Chest CT November 24, 2007. FINDINGS: CTA CHEST FINDINGS Cardiovascular: Noncontrast enhanced series demonstrates aortic atherosclerosis without evidence of intramural hematoma. Preferential opacification of the thoracic aorta postcontrast administration without evidence of thoracic aortic aneurysm or dissection. No central pulmonary embolus on this nondedicated study. Normal heart size. No significant pericardial effusion/thickening. Mediastinum/Nodes: No suspicious thyroid nodule. No pathologically enlarged mediastinal, hilar or axillary lymph nodes. The esophagus is grossly unremarkable. Lungs/Pleura: Solid 2-3 mm left upper lobe pulmonary nodule on image 43/6. Mild diffuse bronchial wall thickening. Mosaic attenuation of the lungs may reflect  small airways disease versus technique. Bibasilar atelectasis. Thin calcified left-sided pleural plaques. Musculoskeletal: No acute osseous abnormality. Multilevel degenerative changes spine. Review of the MIP images confirms the above findings. CTA ABDOMEN AND PELVIS FINDINGS VASCULAR Aorta: Aortic atherosclerosis. Normal caliber aorta without aneurysm, dissection, vasculitis or significant stenosis. Celiac: Patent without evidence of aneurysm, dissection, vasculitis or significant stenosis. SMA: Patent without evidence of aneurysm, dissection, vasculitis or significant stenosis. Renals: Renal arteries are patent without evidence of aneurysm, dissection, vasculitis, fibromuscular dysplasia or significant stenosis. IMA: Patent without evidence of aneurysm, dissection, vasculitis or significant stenosis. Inflow: Patent without evidence of aneurysm, dissection, vasculitis or significant stenosis. Veins: No obvious venous abnormality within the limitations of this arterial phase study. Review of the MIP images confirms the above findings. NON-VASCULAR Hepatobiliary: No suspicious hepatic lesion. Gallbladder is unremarkable. No biliary ductal dilation. Pancreas: No pancreatic ductal dilation or evidence of acute inflammation. Spleen: No splenomegaly. Adrenals/Urinary Tract: Bilateral adrenal glands appear normal. No hydronephrosis. Partially exophytic 11 mm left upper pole renal lesion on image 156/5 measures fluid density compatible with a cyst and considered benign requiring no independent imaging follow-up. Urinary bladder is unremarkable for degree of distension. Stomach/Bowel: No radiopaque enteric contrast material was administered. Stomach is nondistended limiting evaluation. Low-density submucosal infiltration of the gastric antrum. No pathologic dilation of small or large bowel. The appendix is not confidently identified however there is no pericecal inflammation. No evidence of acute bowel inflammation.  Lymphatic: No pathologically enlarged abdominal or pelvic lymph nodes. Reproductive: Status post hysterectomy. No adnexal masses. Other: No significant abdominopelvic free fluid. No pneumoperitoneum. Musculoskeletal: No acute osseous abnormality. Multilevel degenerative changes spine. Mild degenerative spurring of the bilateral hips. Review of the MIP images confirms the above findings. IMPRESSION: 1. No evidence of aortic aneurysm or dissection. 2. Low-density submucosal infiltration of the gastric antrum, which may reflect sequela of chronic gastritis including peptic ulcer disease. 3. Solid 2-3 mm left upper lobe pulmonary nodule. Per Fleischner Society Guidelines, if patient is low risk for malignancy, no routine follow-up imaging is recommended. If patient is high risk for malignancy, a non-contrast Chest CT at 12 months is optional. If performed and the nodule is stable at 12 months, no further follow-up  is recommended. These guidelines do not apply to immunocompromised patients and patients with cancer. Follow up in patients with significant comorbidities as clinically warranted. For lung cancer screening, adhere to Lung-RADS guidelines. Reference: Radiology. 2017; 284(1):228-43. 4.  Aortic Atherosclerosis (ICD10-I70.0). Electronically Signed   By: Dahlia Bailiff M.D.   On: 07/13/2022 15:13   DG Chest 2 View  Result Date: 07/13/2022 CLINICAL DATA:  Chest pain EXAM: CHEST - 2 VIEW COMPARISON:  04/16/2021 and older FINDINGS: Enlarged cardiopericardial silhouette. No consolidation, pneumothorax or effusion. No edema. Eventration of the right hemidiaphragm. Degenerative changes are seen of the spine on lateral view. Air-fluid level along the stomach. IMPRESSION: Enlarged cardiopericardial silhouette. No consolidation, edema or effusion Electronically Signed   By: Jill Side M.D.   On: 07/13/2022 12:06    Procedures Procedures    Medications Ordered in ED Medications  morphine (PF) 4 MG/ML injection  4 mg (4 mg Intravenous Given 07/13/22 1120)  ondansetron (ZOFRAN) injection 4 mg (4 mg Intravenous Given 07/13/22 1221)  morphine (PF) 4 MG/ML injection 4 mg (4 mg Intravenous Given 07/13/22 1221)  iohexol (OMNIPAQUE) 350 MG/ML injection 100 mL (100 mLs Intravenous Contrast Given 07/13/22 1501)    ED Course/ Medical Decision Making/ A&P Clinical Course as of 07/13/22 1639  Thu Jul 13, 2022  1205 Chest x-ray interpreted by me as no acute infiltrate normal mediastinum.  Awaiting radiology reading. [MB]  1521 His workup has been negative for ischemic standpoint.  The CT dissection study due to her back and chest pain.  This was negative for dissection.  They did show some gastric thickening and pulmonary nodule that I reviewed with patient.  Will start her on a PPI.  Her sugar was also elevated and she is not a diabetic so this will need to be followed up with her PCP. [MB]    Clinical Course User Index [MB] Hayden Rasmussen, MD                             Medical Decision Making Amount and/or Complexity of Data Reviewed Labs: ordered. Radiology: ordered.  Risk Prescription drug management.   This patient complains of palpitations chest pain; this involves an extensive number of treatment Options and is a complaint that carries with it a high risk of complications and morbidity. The differential includes ACS, arrhythmia, pneumonia, PE, pneumothorax, dissection, pleurisy, reflux  I ordered, reviewed and interpreted labs, which included CBC with normal hemoglobin normal white count, chemistries normal other than elevated glucose, troponins flat, D-dimer normal I ordered medication IV pain medication and nausea medication and reviewed PMP when indicated. I ordered imaging studies which included chest x-ray and CT angio chest abdomen and pelvis and I independently    visualized and interpreted imaging which showed no evidence of PE or dissection.  Does have pulmonary nodule that will need  follow-up. Additional history obtained from patient's sister Previous records obtained and reviewed in epic no recent admissions Cardiac monitoring reviewed, normal sinus rhythm Social determinants considered, no significant barriers Critical Interventions: None  After the interventions stated above, I reevaluated the patient and found patient's pain to be improved and hemodynamically stable. Admission and further testing considered, will cover with PPI and recommended close follow-up with PCP.  Return instructions discussed. v         Final Clinical Impression(s) / ED Diagnoses Final diagnoses:  Nonspecific chest pain  Hyperglycemia  Pulmonary nodule    Rx /  DC Orders ED Discharge Orders          Ordered    pantoprazole (PROTONIX) 40 MG tablet  Daily        07/13/22 1523              Hayden Rasmussen, MD 07/13/22 [phone]

## 2022-07-17 DIAGNOSIS — M546 Pain in thoracic spine: Secondary | ICD-10-CM | POA: Diagnosis not present

## 2022-07-17 DIAGNOSIS — I10 Essential (primary) hypertension: Secondary | ICD-10-CM | POA: Diagnosis not present

## 2022-07-17 DIAGNOSIS — E782 Mixed hyperlipidemia: Secondary | ICD-10-CM | POA: Diagnosis not present

## 2022-07-17 DIAGNOSIS — R7309 Other abnormal glucose: Secondary | ICD-10-CM | POA: Diagnosis not present

## 2022-07-17 DIAGNOSIS — E039 Hypothyroidism, unspecified: Secondary | ICD-10-CM | POA: Diagnosis not present

## 2022-08-02 IMAGING — MG DIGITAL SCREENING BILAT W/ CAD
5 series · 5 of 5 positions shown · non-contrast
Comparison: Previous exam(s).

CLINICAL DATA: Screening.

EXAM:
DIGITAL SCREENING BILATERAL MAMMOGRAM WITH CAD

[R MLO]
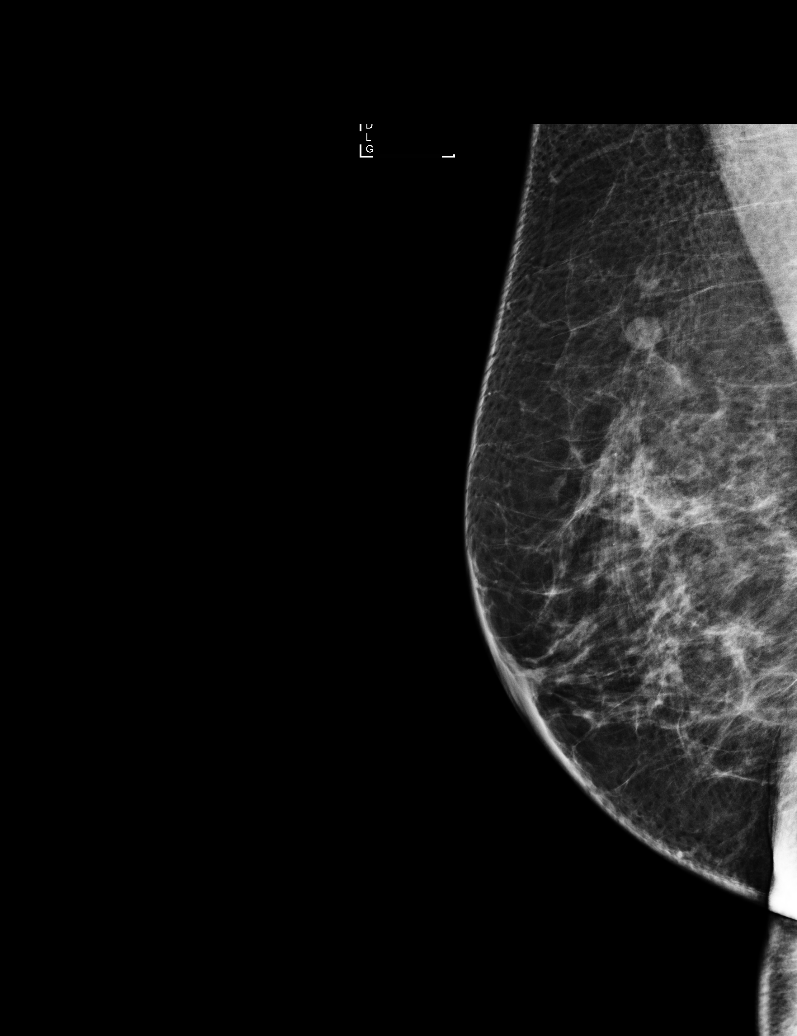

[L CC]
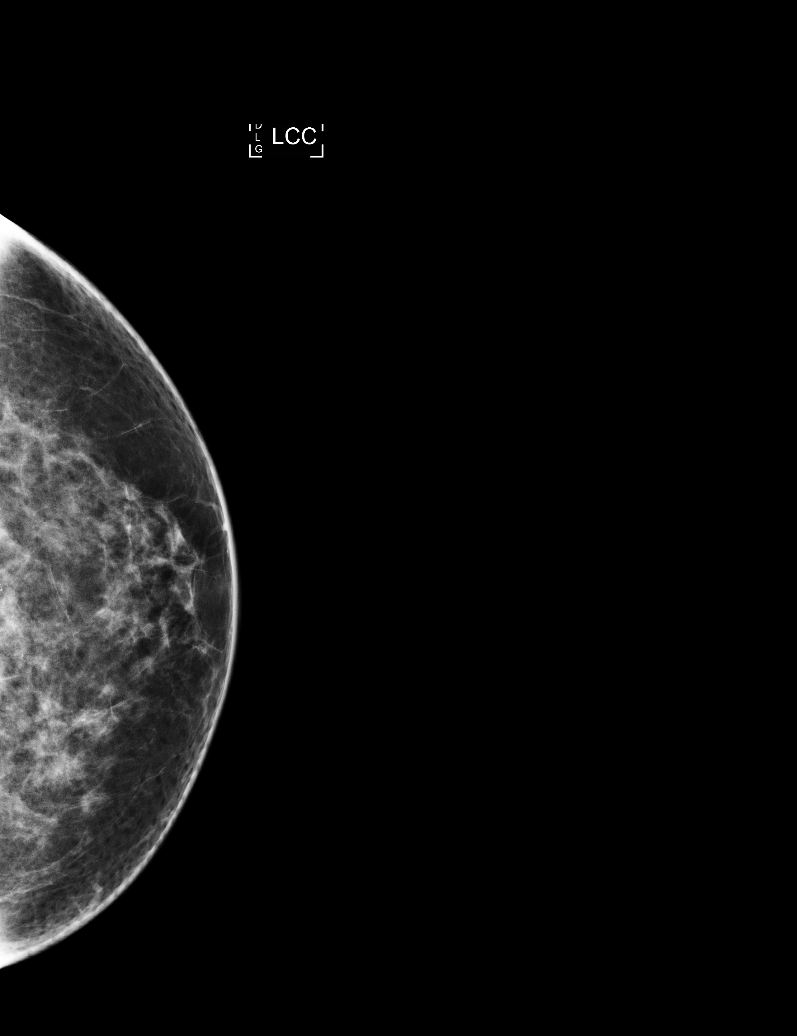

[L MLO (1 of 2)]
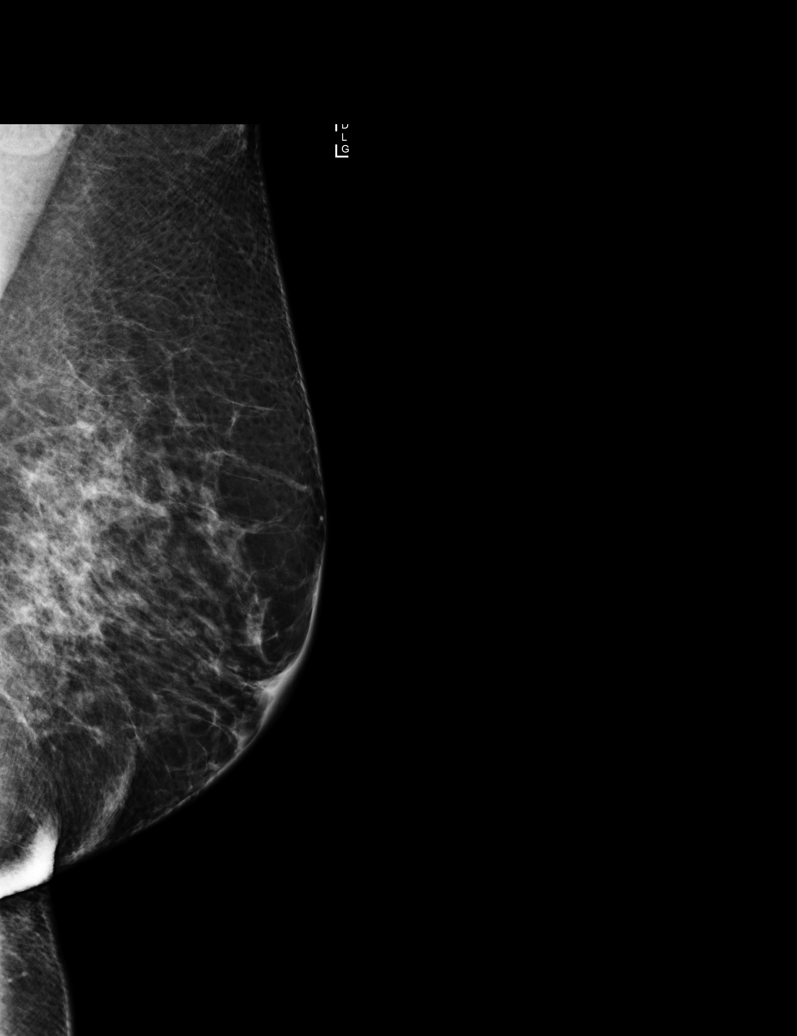

[R CC]
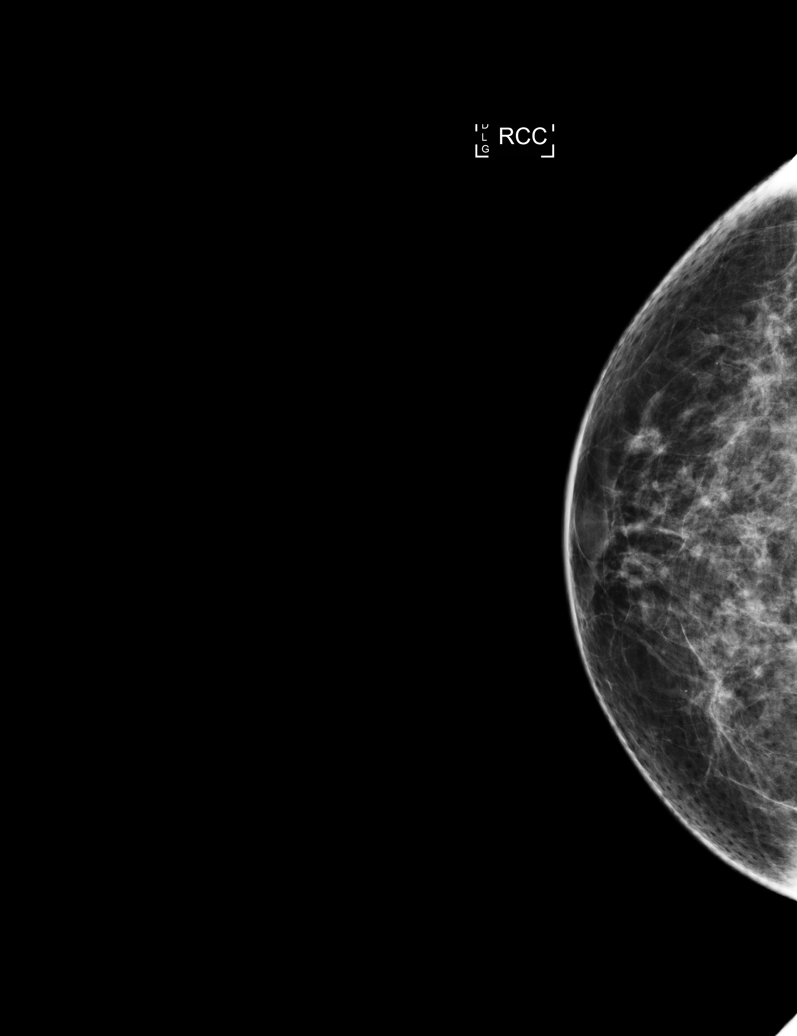

[L MLO (2 of 2)]
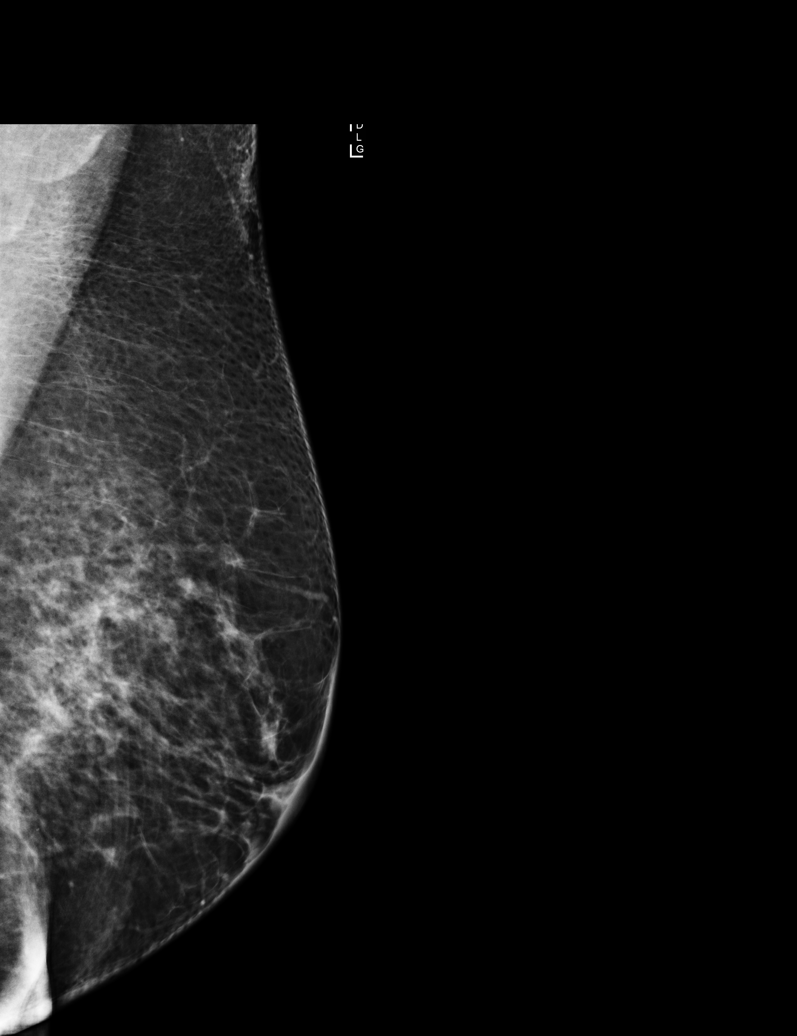

[5 of 5 positions shown; findings below may reference images not displayed]

ACR Breast Density Category c: The breast tissue is heterogeneously
dense, which may obscure small masses.
FINDINGS: There are no findings suspicious for malignancy. Images were
processed with CAD.
IMPRESSION: No mammographic evidence of malignancy. A result letter of this
screening mammogram will be mailed directly to the patient.

RECOMMENDATION:
Screening mammogram in one year. (Code:[id number])

BI-RADS CATEGORY  1: Negative.

## 2022-10-19 DIAGNOSIS — E039 Hypothyroidism, unspecified: Secondary | ICD-10-CM | POA: Diagnosis not present

## 2022-10-25 ENCOUNTER — Other Ambulatory Visit: Payer: Self-pay | Admitting: Family Medicine

## 2022-10-25 DIAGNOSIS — Z1231 Encounter for screening mammogram for malignant neoplasm of breast: Secondary | ICD-10-CM

## 2022-11-16 DIAGNOSIS — L570 Actinic keratosis: Secondary | ICD-10-CM | POA: Diagnosis not present

## 2022-11-16 DIAGNOSIS — D485 Neoplasm of uncertain behavior of skin: Secondary | ICD-10-CM | POA: Diagnosis not present

## 2022-11-16 DIAGNOSIS — L578 Other skin changes due to chronic exposure to nonionizing radiation: Secondary | ICD-10-CM | POA: Diagnosis not present

## 2022-11-17 ENCOUNTER — Ambulatory Visit
Admission: RE | Admit: 2022-11-17 | Discharge: 2022-11-17 | Disposition: A | Discharge status: Home / Self Care | Payer: Medicare Other | Source: Ambulatory Visit | Attending: Family Medicine | Admitting: Family Medicine

## 2022-11-17 DIAGNOSIS — Z1231 Encounter for screening mammogram for malignant neoplasm of breast: Secondary | ICD-10-CM | POA: Diagnosis not present

## 2022-12-19 DIAGNOSIS — Z0000 Encounter for general adult medical examination without abnormal findings: Secondary | ICD-10-CM | POA: Diagnosis not present

## 2022-12-19 DIAGNOSIS — I10 Essential (primary) hypertension: Secondary | ICD-10-CM | POA: Diagnosis not present

## 2022-12-19 DIAGNOSIS — E039 Hypothyroidism, unspecified: Secondary | ICD-10-CM | POA: Diagnosis not present

## 2022-12-19 DIAGNOSIS — E782 Mixed hyperlipidemia: Secondary | ICD-10-CM | POA: Diagnosis not present

## 2022-12-19 DIAGNOSIS — Z1211 Encounter for screening for malignant neoplasm of colon: Secondary | ICD-10-CM | POA: Diagnosis not present

## 2022-12-25 DIAGNOSIS — D485 Neoplasm of uncertain behavior of skin: Secondary | ICD-10-CM | POA: Diagnosis not present

## 2023-01-14 DIAGNOSIS — S0501XA Injury of conjunctiva and corneal abrasion without foreign body, right eye, initial encounter: Secondary | ICD-10-CM | POA: Diagnosis not present

## 2023-01-23 DIAGNOSIS — E782 Mixed hyperlipidemia: Secondary | ICD-10-CM | POA: Diagnosis not present

## 2023-02-12 DIAGNOSIS — H04123 Dry eye syndrome of bilateral lacrimal glands: Secondary | ICD-10-CM | POA: Diagnosis not present

## 2023-02-12 DIAGNOSIS — H43812 Vitreous degeneration, left eye: Secondary | ICD-10-CM | POA: Diagnosis not present

## 2023-02-12 DIAGNOSIS — H2513 Age-related nuclear cataract, bilateral: Secondary | ICD-10-CM | POA: Diagnosis not present

## 2023-02-12 DIAGNOSIS — H02839 Dermatochalasis of unspecified eye, unspecified eyelid: Secondary | ICD-10-CM | POA: Diagnosis not present

## 2023-02-12 DIAGNOSIS — H25013 Cortical age-related cataract, bilateral: Secondary | ICD-10-CM | POA: Diagnosis not present

## 2023-04-12 DIAGNOSIS — L821 Other seborrheic keratosis: Secondary | ICD-10-CM | POA: Diagnosis not present

## 2023-04-12 DIAGNOSIS — L570 Actinic keratosis: Secondary | ICD-10-CM | POA: Diagnosis not present

## 2023-04-12 DIAGNOSIS — D485 Neoplasm of uncertain behavior of skin: Secondary | ICD-10-CM | POA: Diagnosis not present

## 2023-04-12 DIAGNOSIS — Z85820 Personal history of malignant melanoma of skin: Secondary | ICD-10-CM | POA: Diagnosis not present

## 2023-05-30 ENCOUNTER — Ambulatory Visit (HOSPITAL_BASED_OUTPATIENT_CLINIC_OR_DEPARTMENT_OTHER)
Admission: EM | Admit: 2023-05-30 | Discharge: 2023-05-30 | Disposition: A | Discharge status: Home / Self Care | Payer: Medicare Other | Attending: Family Medicine | Admitting: Family Medicine

## 2023-05-30 ENCOUNTER — Encounter (HOSPITAL_BASED_OUTPATIENT_CLINIC_OR_DEPARTMENT_OTHER): Payer: Self-pay

## 2023-05-30 ENCOUNTER — Other Ambulatory Visit (HOSPITAL_BASED_OUTPATIENT_CLINIC_OR_DEPARTMENT_OTHER): Payer: Self-pay

## 2023-05-30 DIAGNOSIS — R509 Fever, unspecified: Secondary | ICD-10-CM | POA: Diagnosis not present

## 2023-05-30 DIAGNOSIS — R051 Acute cough: Secondary | ICD-10-CM | POA: Diagnosis not present

## 2023-05-30 DIAGNOSIS — H66001 Acute suppurative otitis media without spontaneous rupture of ear drum, right ear: Secondary | ICD-10-CM

## 2023-05-30 DIAGNOSIS — J209 Acute bronchitis, unspecified: Secondary | ICD-10-CM

## 2023-05-30 LAB — POC COVID19/FLU A&B COMBO
Covid Antigen, POC: NEGATIVE
Influenza A Antigen, POC: NEGATIVE
Influenza B Antigen, POC: NEGATIVE

## 2023-05-30 MED ORDER — ALBUTEROL SULFATE HFA 108 (90 BASE) MCG/ACT IN AERS
2.0000 | INHALATION_SPRAY | RESPIRATORY_TRACT | 0 refills | Status: AC | PRN
  Filled 2023-05-30 (×2): qty 6.7, 30d supply, fill #0

## 2023-05-30 MED ORDER — COMPACT SPACE CHAMBER DEVI
0 refills | Status: AC
  Filled 2023-05-30: qty 1, 30d supply, fill #0

## 2023-05-30 MED ORDER — METHYLPREDNISOLONE ACETATE 80 MG/ML IJ SUSP
80.0000 mg | Freq: Once | INTRAMUSCULAR | Status: AC
  Administered 2023-05-30: 80 mg via INTRAMUSCULAR

## 2023-05-30 MED ORDER — IPRATROPIUM-ALBUTEROL 0.5-2.5 (3) MG/3ML IN SOLN
3.0000 mL | Freq: Once | RESPIRATORY_TRACT | Status: AC
  Administered 2023-05-30: 3 mL via RESPIRATORY_TRACT

## 2023-05-30 MED ORDER — CEFDINIR 300 MG PO CAPS
300.0000 mg | ORAL_CAPSULE | Freq: Two times a day (BID) | ORAL | 0 refills | Status: AC
  Filled 2023-05-30: qty 20, 10d supply, fill #0

## 2023-05-30 NOTE — ED Triage Notes (Signed)
 Fever and cough onset Friday. Had shortness of breath last night and went to ER. States too many people so went home. Tylenol taken at 1230am.

## 2023-05-30 NOTE — Discharge Instructions (Addendum)
 Rapid flu and COVID were negative.  Lab work is pending.  Will adjust her plan of care as needed based on her lab results.  Encouraged to get a chest x-ray but she would have to go to [facility] and she declined for now.  Provided order for chest x-ray that she can get later this week if she decides to get it.  Would adjust her plan of care, if indicated, once her chest x-ray results.  Exam is most consistent with acute viral bronchitis and a right ear infection.  Will treat with Depo-Medrol  injection, given during the visit today.  Provided inhaler with spacer, 2 puffs, every 4 hours, as needed for wheezing.  Cefdinir , 300 mg, twice daily, for 10 days, for ear infection.  Follow-up if symptoms do not improve, worsen, or if new symptoms occur.

## 2023-05-30 NOTE — ED Provider Notes (Signed)
 [facility]    CSN: [id number] Arrival date & time: 05/30/23  0817      History   Chief Complaint Chief Complaint  Patient presents with   Fever   Cough    HPI Yetunde Leis is a 68 y.o. female.   Here with report of fever, cough and congestion that is now in her chest.  Symptoms started on 05/25/2023.  She went to the emergency room at [facility] last night but when she discovered the weight she left without being seen.  Reports she was not able to sleep at all last night due to shortness of breath and fever.  She is wheezing and at times her cough is like a bark.  She does not have any inhalers at home.  Denies nausea, vomiting, diarrhea, constipation.   Fever Associated symptoms: congestion, cough and ear pain   Associated symptoms: no chest pain, no chills, no diarrhea, no dysuria, no nausea, no rash, no rhinorrhea, no sore throat and no vomiting   Cough Associated symptoms: ear pain, fever, shortness of breath and wheezing   Associated symptoms: no chest pain, no chills, no rash, no rhinorrhea and no sore throat     Past Medical History:  Diagnosis Date   Basal cell carcinoma    back; chest; ankle/lower leg; face   Heart murmur    HLD (hyperlipidemia)    Hypertension    Hypothyroidism    Syncope 08/03/2016    Patient Active Problem List   Diagnosis Date Noted   HLD (hyperlipidemia)    Hypothyroidism    Chest pain 08/03/2016   Sinusitis 08/03/2016   HTN (hypertension) 08/03/2016    Past Surgical History:  Procedure Laterality Date   ABDOMINAL HYSTERECTOMY  1993   APPENDECTOMY  1976   BACK SURGERY     BASAL CELL CARCINOMA EXCISION Right 06/2010   scapula   BASAL CELL CARCINOMA EXCISION Right 07/2016   chest   CESAREAN SECTION  1986   LAMINECTOMY AND MICRODISCECTOMY THORACIC SPINE Left 10/2007   Left sided T11-12 transthoracic microdiskectomy/notes 09/20/2010   MOHS SURGERY Right 2012; 2017   ankle/lower leg; face   TUBAL LIGATION      OB  History   No obstetric history on file.      Home Medications    Prior to Admission medications   Medication Sig Start Date End Date Taking? Authorizing Provider  albuterol  (VENTOLIN  HFA) 108 (90 Base) MCG/ACT inhaler Inhale 2 puffs into the lungs every 4 (four) hours as needed for wheezing or shortness of breath. Rinse mouth after use 05/30/23 06/29/23 Yes Ival Domino, FNP  cefdinir  (OMNICEF ) 300 MG capsule Take 1 capsule (300 mg total) by mouth 2 (two) times daily for 10 days. 05/30/23 06/09/23 Yes Ival Domino, FNP  Spacer/Aero-Holding Chambers (COMPACT SPACE CHAMBER) DEVI Use with the Albuterol  Inhaler 05/30/23  Yes Ival Domino, FNP  acetaminophen  (TYLENOL ) 500 MG tablet Take 1,000 mg by mouth every 6 (six) hours as needed for moderate pain.     [provider]  aspirin  EC 81 MG tablet Take 81 mg by mouth daily.    [provider]  cholecalciferol (VITAMIN D) 400 units TABS tablet Take 400 Units by mouth daily.    [provider]  CRESTOR  40 MG tablet Take 40 mg by mouth every evening.  06/17/14   [provider]  Desvenlafaxine ER (PRISTIQ) 50 MG TB24 Take 1 tablet by mouth daily. 02/24/20   [provider]  levothyroxine  (SYNTHROID )  112 MCG tablet Take 112 mcg by mouth daily. 06/05/20   [provider]  lisinopril  (PRINIVIL ,ZESTRIL ) 5 MG tablet Take 5 mg by mouth at bedtime.  06/17/14   [provider]  naproxen  (NAPROSYN ) 500 MG tablet Take 1 tablet (500 mg total) by mouth 2 (two) times daily. 10/25/20   Wieters, Hallie C, PA-C  Omega-3 1000 MG CAPS Take 1,000 mg by mouth daily.    [provider]  pantoprazole  (PROTONIX ) 40 MG tablet Take 1 tablet (40 mg total) by mouth daily. 07/13/22   Towana Ozell BROCKS, MD  SYNTHROID  150 MCG tablet Take 150 mcg by mouth daily.  06/30/14   [provider]  tiZANidine  (ZANAFLEX ) 2 MG tablet Take 1-2 tablets (2-4 mg total) by mouth every 6 (six) hours as needed for muscle spasms.  10/25/20   Wieters, Hallie C, PA-C  venlafaxine  XR (EFFEXOR -XR) 37.5 MG 24 hr capsule Take 1 capsule by mouth daily. 07/02/14   [provider]  vitamin C (ASCORBIC ACID) 500 MG tablet Take 1,000 mg by mouth daily.    [provider]    Family History Family History  Problem Relation Age of Onset   Lung cancer Mother    Hypertension Mother    Breast cancer Maternal Grandmother 108   Breast cancer Cousin    Ovarian cancer Neg Hx     Social History Social History   Tobacco Use   Smoking status: Never   Smokeless tobacco: Never  Substance Use Topics   Alcohol use: Yes    Comment: 08/03/2016 might have 1 drink/year;  if that   Drug use: No     Allergies   Nystatin and Codeine   Review of Systems Review of Systems  Constitutional:  Positive for fever. Negative for chills.  HENT:  Positive for congestion and ear pain. Negative for postnasal drip, rhinorrhea, sinus pressure, sinus pain and sore throat.   Eyes:  Negative for pain and visual disturbance.  Respiratory:  Positive for cough, chest tightness, shortness of breath and wheezing.   Cardiovascular:  Negative for chest pain and palpitations.  Gastrointestinal:  Negative for abdominal pain, constipation, diarrhea, nausea and vomiting.  Genitourinary:  Negative for dysuria and hematuria.  Musculoskeletal:  Positive for arthralgias. Negative for back pain.  Skin:  Negative for color change and rash.  Neurological:  Negative for seizures and syncope.  All other systems reviewed and are negative.    Physical Exam Triage Vital Signs ED Triage Vitals  Encounter Vitals Group     BP 05/30/23 0827 (!) 146/74     Systolic BP Percentile --      Diastolic BP Percentile --      Pulse Rate 05/30/23 0827 74     Resp 05/30/23 0827 20     Temp 05/30/23 0827 98 F (36.7 C)     Temp Source 05/30/23 0827 Oral     SpO2 05/30/23 0827 92 %     Weight 05/30/23 0829 168 lb (76.2 kg)     Height --      Head  Circumference --      Peak Flow --      Pain Score 05/30/23 0829 0     Pain Loc --      Pain Education --      Exclude from Growth Chart --    No data found.  Updated Vital Signs BP (!) 146/74 (BP Location: Right Arm)   Pulse 74   Temp 98 F (36.7  C) (Oral)   Resp 20   Wt 168 lb (76.2 kg)   SpO2 92%   BMI 29.76 kg/m   Visual Acuity Right Eye Distance:   Left Eye Distance:   Bilateral Distance:    Right Eye Near:   Left Eye Near:    Bilateral Near:     Physical Exam Constitutional:      Appearance: Normal appearance. She is ill-appearing. She is not toxic-appearing.  HENT:     Head: Normocephalic and atraumatic.     Right Ear: Hearing, ear canal and external ear normal. A middle ear effusion is present. Tympanic membrane is erythematous.     Left Ear: Hearing, tympanic membrane, ear canal and external ear normal.     Nose: Congestion and rhinorrhea present. Rhinorrhea is clear.     Right Turbinates: Swollen.     Left Turbinates: Swollen.     Right Sinus: No maxillary sinus tenderness or frontal sinus tenderness.     Left Sinus: No maxillary sinus tenderness or frontal sinus tenderness.     Mouth/Throat:     Mouth: Mucous membranes are moist.     Pharynx: Uvula midline. No oropharyngeal exudate or posterior oropharyngeal erythema.     Tonsils: No tonsillar exudate or tonsillar abscesses.  Eyes:     General: Lids are normal. Vision grossly intact.     Conjunctiva/sclera: Conjunctivae normal.     Pupils: Pupils are equal, round, and reactive to light.  Cardiovascular:     Rate and Rhythm: Normal rate and regular rhythm.     Heart sounds: Normal heart sounds, S1 normal and S2 normal. No murmur heard. Pulmonary:     Effort: Pulmonary effort is normal.     Breath sounds: Examination of the right-upper field reveals decreased breath sounds, wheezing and rhonchi. Examination of the left-upper field reveals decreased breath sounds, wheezing and rhonchi. Examination of the  right-middle field reveals decreased breath sounds and wheezing. Examination of the left-middle field reveals decreased breath sounds and wheezing. Examination of the right-lower field reveals decreased breath sounds and wheezing. Examination of the left-lower field reveals decreased breath sounds and wheezing. Decreased breath sounds, wheezing and rhonchi present. No rales.     Comments: Reassessment after DuoNeb treatment: Significant improvement in lung sounds with decrease in wheezing and breath sounds are much louder throughout the lung fields. Abdominal:     General: Abdomen is flat. Bowel sounds are normal.     Palpations: Abdomen is soft.     Tenderness: There is abdominal tenderness.  Musculoskeletal:     Cervical back: Normal range of motion and neck supple.  Lymphadenopathy:     Head:     Right side of head: No submental, submandibular, tonsillar, preauricular or posterior auricular adenopathy.     Left side of head: No submental, submandibular, tonsillar, preauricular or posterior auricular adenopathy.     Cervical: Cervical adenopathy present.     Right cervical: Superficial cervical adenopathy present.     Left cervical: Superficial cervical adenopathy present.  Skin:    General: Skin is warm and dry.     Findings: No rash.  Neurological:     Mental Status: She is alert and oriented to person, place, and time.     Gait: Gait normal.  Psychiatric:        Mood and Affect: Mood normal.        Behavior: Behavior normal.      UC Treatments / Results  Labs (all labs ordered are listed,  but only abnormal results are displayed) Labs Reviewed  POC COVID19/FLU A&B COMBO - Normal  CBC WITH DIFFERENTIAL/PLATELET  COMPREHENSIVE METABOLIC PANEL    EKG   Radiology No results found.  Procedures Procedures (including critical care time)  Medications Ordered in UC Medications  ipratropium-albuterol  (DUONEB) 0.5-2.5 (3) MG/3ML nebulizer solution 3 mL (3 mLs Nebulization  Given 05/30/23 0910)  methylPREDNISolone  acetate (DEPO-MEDROL ) injection 80 mg (80 mg Intramuscular Given 05/30/23 0925)    Initial Impression / Assessment and Plan / UC Course  I have reviewed the triage vital signs and the nursing notes.  Pertinent labs & imaging results that were available during my care of the patient were reviewed by me and considered in my medical decision making (see chart for details).  Rapid flu and COVID were negative.  CBC with differential and CMP are pending.  Given orders to get chest x-ray at [facility].  She is not sure she is going to get the x-ray but she has the order if she needs it.  Will adjust her plan of care if needed based on results of the blood work and/or the x-ray.  Given Depo Depo-Medrol , 80 mg, during the office visit.  Cefdinir , 300 mg, twice daily for 10 days for ear infection.  Provided albuterol  inhaler with spacer, 2 puffs, every 4 hours, as needed for cough or wheezing.  Follow-up with PCP or return here if symptoms do not improve, worsen, or new symptoms occur. Final Clinical Impressions(s) / UC Diagnoses   Final diagnoses:  Fever, unspecified  Acute cough  Acute bronchitis, unspecified organism  Non-recurrent acute suppurative otitis media of right ear without spontaneous rupture of tympanic membrane     Discharge Instructions      Rapid flu and COVID were negative.  Lab work is pending.  Will adjust her plan of care as needed based on her lab results.  Encouraged to get a chest x-ray but she would have to go to [facility] and she declined for now.  Provided order for chest x-ray that she can get later this week if she decides to get it.  Would adjust her plan of care, if indicated, once her chest x-ray results.  Exam is most consistent with acute viral bronchitis and a right ear infection.  Will treat with Depo-Medrol  injection, given during the visit today.  Provided inhaler with spacer, 2 puffs, every 4 hours, as needed  for wheezing.  Cefdinir , 300 mg, twice daily, for 10 days, for ear infection.  Follow-up if symptoms do not improve, worsen, or if new symptoms occur.     ED Prescriptions     Medication Sig Dispense Auth. Provider   cefdinir  (OMNICEF ) 300 MG capsule Take 1 capsule (300 mg total) by mouth 2 (two) times daily for 10 days. 20 capsule Ival Domino, FNP   albuterol  (VENTOLIN  HFA) 108 (90 Base) MCG/ACT inhaler Inhale 2 puffs into the lungs every 4 (four) hours as needed for wheezing or shortness of breath. Rinse mouth after use 6.7 g Nick Stults, FNP   Spacer/Aero-Holding Chambers (COMPACT SPACE CHAMBER) DEVI Use with the Albuterol  Inhaler 1 each Ival Domino, FNP      PDMP not reviewed this encounter.   Ival Domino, FNP 05/30/23 1050

## 2023-05-31 LAB — COMPREHENSIVE METABOLIC PANEL
ALT: 24 [IU]/L (ref 0–32)
AST: 20 [IU]/L (ref 0–40)
Albumin: 4.8 g/dL (ref 3.9–4.9)
Alkaline Phosphatase: 99 [IU]/L (ref 44–121)
BUN/Creatinine Ratio: 13 (ref 12–28)
BUN: 10 mg/dL (ref 8–27)
Bilirubin Total: 0.3 mg/dL (ref 0.0–1.2)
CO2: 25 mmol/L (ref 20–29)
Calcium: 9.6 mg/dL (ref 8.7–10.3)
Chloride: 99 mmol/L (ref 96–106)
Creatinine, Ser: 0.78 mg/dL (ref 0.57–1.00)
Globulin, Total: 2.5 g/dL (ref 1.5–4.5)
Glucose: 114 mg/dL — ABNORMAL HIGH (ref 70–99)
Potassium: 4 mmol/L (ref 3.5–5.2)
Sodium: 140 mmol/L (ref 134–144)
Total Protein: 7.3 g/dL (ref 6.0–8.5)
eGFR: 83 mL/min/{1.73_m2} (ref >59)

## 2023-05-31 LAB — CBC WITH DIFFERENTIAL/PLATELET
Basophils Absolute: 0.1 10*3/uL (ref 0.0–0.2)
Basos: 1 %
EOS (ABSOLUTE): 0.1 10*3/uL (ref 0.0–0.4)
Eos: 2 %
Hematocrit: 42.8 % (ref 34.0–46.6)
Hemoglobin: 14.2 g/dL (ref 11.1–15.9)
Immature Grans (Abs): 0 10*3/uL (ref 0.0–0.1)
Immature Granulocytes: 1 %
Lymphocytes Absolute: 3.5 10*3/uL — ABNORMAL HIGH (ref 0.7–3.1)
Lymphs: 40 %
MCH: 31.3 pg (ref 26.6–33.0)
MCHC: 33.2 g/dL (ref 31.5–35.7)
MCV: 94 fL (ref 79–97)
Monocytes Absolute: 0.5 10*3/uL (ref 0.1–0.9)
Monocytes: 5 %
Neutrophils Absolute: 4.5 10*3/uL (ref 1.4–7.0)
Neutrophils: 51 %
Platelets: 302 10*3/uL (ref 150–450)
RBC: 4.54 x10E6/uL (ref 3.77–5.28)
RDW: 12 % (ref 11.7–15.4)
WBC: 8.7 10*3/uL (ref 3.4–10.8)

## 2023-06-01 ENCOUNTER — Encounter (HOSPITAL_BASED_OUTPATIENT_CLINIC_OR_DEPARTMENT_OTHER): Payer: Self-pay | Admitting: Family Medicine

## 2023-06-01 ENCOUNTER — Telehealth (HOSPITAL_BASED_OUTPATIENT_CLINIC_OR_DEPARTMENT_OTHER): Payer: Self-pay | Admitting: Family Medicine

## 2023-06-01 NOTE — Telephone Encounter (Signed)
 Unable to reach the patient and her VM was full.  Tried to reach her husband twice and the number [phone]) could not be completed.  The patient was instructed in the visit that if her labs were normal, she could access them on the portal.  She does not currently have a portal account.  Will leave a Phone Message note.  She was treated with antibiotics for Otitis Media.   Her labs were normal with a mildly elevated blood sugar, but she was not fasting, so even that is normal.

## 2023-06-01 NOTE — Progress Notes (Signed)
 Unable to reach the patient and her VM was full.  Tried to reach her husband twice and the number [phone]) could not be completed.  The patient was instructed in the visit that if her labs were normal, she could access them on the portal.  She does not currently have a portal account.  Will leave a Phone Message note.  She was treated with antibiotics for Otitis Media.   Her labs were normal with a mildly elevated blood sugar, but she was not fasting, so even that is normal.

## 2023-06-19 DIAGNOSIS — I10 Essential (primary) hypertension: Secondary | ICD-10-CM | POA: Diagnosis not present

## 2023-06-19 DIAGNOSIS — E039 Hypothyroidism, unspecified: Secondary | ICD-10-CM | POA: Diagnosis not present

## 2023-06-19 DIAGNOSIS — Z23 Encounter for immunization: Secondary | ICD-10-CM | POA: Diagnosis not present

## 2023-06-19 DIAGNOSIS — E782 Mixed hyperlipidemia: Secondary | ICD-10-CM | POA: Diagnosis not present

## 2023-07-19 DIAGNOSIS — C44519 Basal cell carcinoma of skin of other part of trunk: Secondary | ICD-10-CM | POA: Diagnosis not present

## 2023-07-19 DIAGNOSIS — D225 Melanocytic nevi of trunk: Secondary | ICD-10-CM | POA: Diagnosis not present

## 2023-07-19 DIAGNOSIS — L570 Actinic keratosis: Secondary | ICD-10-CM | POA: Diagnosis not present

## 2023-07-19 DIAGNOSIS — L814 Other melanin hyperpigmentation: Secondary | ICD-10-CM | POA: Diagnosis not present

## 2023-07-19 DIAGNOSIS — Z85820 Personal history of malignant melanoma of skin: Secondary | ICD-10-CM | POA: Diagnosis not present

## 2023-08-02 DIAGNOSIS — D175 Benign lipomatous neoplasm of intra-abdominal organs: Secondary | ICD-10-CM | POA: Diagnosis not present

## 2023-08-02 DIAGNOSIS — D123 Benign neoplasm of transverse colon: Secondary | ICD-10-CM | POA: Diagnosis not present

## 2023-08-02 DIAGNOSIS — Z1211 Encounter for screening for malignant neoplasm of colon: Secondary | ICD-10-CM | POA: Diagnosis not present

## 2023-09-17 DIAGNOSIS — I10 Essential (primary) hypertension: Secondary | ICD-10-CM | POA: Diagnosis not present

## 2023-09-17 DIAGNOSIS — M7661 Achilles tendinitis, right leg: Secondary | ICD-10-CM | POA: Diagnosis not present

## 2023-10-28 ENCOUNTER — Observation Stay (HOSPITAL_COMMUNITY)
Admission: EM | Admit: 2023-10-28 | Discharge: 2023-10-29 | Disposition: A | Discharge status: Home / Self Care | Attending: Internal Medicine | Admitting: Internal Medicine

## 2023-10-28 ENCOUNTER — Observation Stay (HOSPITAL_COMMUNITY)

## 2023-10-28 ENCOUNTER — Emergency Department (HOSPITAL_COMMUNITY)

## 2023-10-28 DIAGNOSIS — M47812 Spondylosis without myelopathy or radiculopathy, cervical region: Secondary | ICD-10-CM | POA: Diagnosis not present

## 2023-10-28 DIAGNOSIS — G43909 Migraine, unspecified, not intractable, without status migrainosus: Secondary | ICD-10-CM | POA: Diagnosis not present

## 2023-10-28 DIAGNOSIS — E039 Hypothyroidism, unspecified: Secondary | ICD-10-CM | POA: Diagnosis not present

## 2023-10-28 DIAGNOSIS — J323 Chronic sphenoidal sinusitis: Secondary | ICD-10-CM | POA: Diagnosis not present

## 2023-10-28 DIAGNOSIS — W19XXXA Unspecified fall, initial encounter: Secondary | ICD-10-CM | POA: Diagnosis not present

## 2023-10-28 DIAGNOSIS — J019 Acute sinusitis, unspecified: Secondary | ICD-10-CM | POA: Insufficient documentation

## 2023-10-28 DIAGNOSIS — R410 Disorientation, unspecified: Secondary | ICD-10-CM | POA: Diagnosis not present

## 2023-10-28 DIAGNOSIS — Z85828 Personal history of other malignant neoplasm of skin: Secondary | ICD-10-CM | POA: Diagnosis not present

## 2023-10-28 DIAGNOSIS — I63522 Cerebral infarction due to unspecified occlusion or stenosis of left anterior cerebral artery: Secondary | ICD-10-CM

## 2023-10-28 DIAGNOSIS — Z79899 Other long term (current) drug therapy: Secondary | ICD-10-CM | POA: Diagnosis not present

## 2023-10-28 DIAGNOSIS — I10 Essential (primary) hypertension: Secondary | ICD-10-CM | POA: Diagnosis not present

## 2023-10-28 DIAGNOSIS — E782 Mixed hyperlipidemia: Secondary | ICD-10-CM

## 2023-10-28 DIAGNOSIS — I672 Cerebral atherosclerosis: Secondary | ICD-10-CM | POA: Diagnosis not present

## 2023-10-28 DIAGNOSIS — Z8673 Personal history of transient ischemic attack (TIA), and cerebral infarction without residual deficits: Secondary | ICD-10-CM | POA: Diagnosis not present

## 2023-10-28 DIAGNOSIS — E785 Hyperlipidemia, unspecified: Secondary | ICD-10-CM | POA: Diagnosis present

## 2023-10-28 DIAGNOSIS — Z7982 Long term (current) use of aspirin: Secondary | ICD-10-CM | POA: Insufficient documentation

## 2023-10-28 DIAGNOSIS — R55 Syncope and collapse: Principal | ICD-10-CM | POA: Diagnosis present

## 2023-10-28 DIAGNOSIS — R519 Headache, unspecified: Secondary | ICD-10-CM | POA: Diagnosis present

## 2023-10-28 DIAGNOSIS — R42 Dizziness and giddiness: Secondary | ICD-10-CM | POA: Diagnosis present

## 2023-10-28 DIAGNOSIS — R29701 NIHSS score 1: Secondary | ICD-10-CM

## 2023-10-28 DIAGNOSIS — R7303 Prediabetes: Secondary | ICD-10-CM | POA: Diagnosis not present

## 2023-10-28 DIAGNOSIS — I639 Cerebral infarction, unspecified: Principal | ICD-10-CM | POA: Clinically undetermined

## 2023-10-28 DIAGNOSIS — I700 Atherosclerosis of aorta: Secondary | ICD-10-CM | POA: Diagnosis not present

## 2023-10-28 DIAGNOSIS — I6782 Cerebral ischemia: Secondary | ICD-10-CM | POA: Diagnosis not present

## 2023-10-28 DIAGNOSIS — F439 Reaction to severe stress, unspecified: Secondary | ICD-10-CM

## 2023-10-28 DIAGNOSIS — I6521 Occlusion and stenosis of right carotid artery: Secondary | ICD-10-CM | POA: Diagnosis not present

## 2023-10-28 DIAGNOSIS — M4802 Spinal stenosis, cervical region: Secondary | ICD-10-CM | POA: Diagnosis not present

## 2023-10-28 DIAGNOSIS — R29818 Other symptoms and signs involving the nervous system: Secondary | ICD-10-CM | POA: Diagnosis not present

## 2023-10-28 DIAGNOSIS — J329 Chronic sinusitis, unspecified: Secondary | ICD-10-CM | POA: Diagnosis present

## 2023-10-28 LAB — CBG MONITORING, ED: Glucose-Capillary: 110 mg/dL — ABNORMAL HIGH (ref 70–99)

## 2023-10-28 LAB — DIFFERENTIAL
Abs Immature Granulocytes: 0.03 10*3/uL (ref 0.00–0.07)
Basophils Absolute: 0 10*3/uL (ref 0.0–0.1)
Basophils Relative: 1 %
Eosinophils Absolute: 0.1 10*3/uL (ref 0.0–0.5)
Eosinophils Relative: 1 %
Immature Granulocytes: 1 %
Lymphocytes Relative: 35 %
Lymphs Abs: 2.3 10*3/uL (ref 0.7–4.0)
Monocytes Absolute: 0.5 10*3/uL (ref 0.1–1.0)
Monocytes Relative: 7 %
Neutro Abs: 3.6 10*3/uL (ref 1.7–7.7)
Neutrophils Relative %: 55 %

## 2023-10-28 LAB — COMPREHENSIVE METABOLIC PANEL WITH GFR
ALT: 33 U/L (ref 0–44)
AST: 38 U/L (ref 15–41)
Albumin: 4.5 g/dL (ref 3.5–5.0)
Alkaline Phosphatase: 57 U/L (ref 38–126)
Anion gap: 15 (ref 5–15)
BUN: 11 mg/dL (ref 8–23)
CO2: 24 mmol/L (ref 22–32)
Calcium: 9.7 mg/dL (ref 8.9–10.3)
Chloride: 100 mmol/L (ref 98–111)
Creatinine, Ser: 0.73 mg/dL (ref 0.44–1.00)
GFR, Estimated: >60 mL/min (ref >60)
Glucose, Bld: 112 mg/dL — ABNORMAL HIGH (ref 70–99)
Potassium: 3.7 mmol/L (ref 3.5–5.1)
Sodium: 139 mmol/L (ref 135–145)
Total Bilirubin: 0.7 mg/dL (ref 0.0–1.2)
Total Protein: 7.2 g/dL (ref 6.5–8.1)

## 2023-10-28 LAB — I-STAT CHEM 8, ED
BUN: 13 mg/dL (ref 8–23)
Calcium, Ion: 1.15 mmol/L (ref 1.15–1.40)
Chloride: 101 mmol/L (ref 98–111)
Creatinine, Ser: 0.7 mg/dL (ref 0.44–1.00)
Glucose, Bld: 113 mg/dL — ABNORMAL HIGH (ref 70–99)
HCT: 41 % (ref 36.0–46.0)
Hemoglobin: 13.9 g/dL (ref 12.0–15.0)
Potassium: 3.9 mmol/L (ref 3.5–5.1)
Sodium: 139 mmol/L (ref 135–145)
TCO2: 28 mmol/L (ref 22–32)

## 2023-10-28 LAB — CBC
HCT: 40.1 % (ref 36.0–46.0)
Hemoglobin: 13.7 g/dL (ref 12.0–15.0)
MCH: 32 pg (ref 26.0–34.0)
MCHC: 34.2 g/dL (ref 30.0–36.0)
MCV: 93.7 fL (ref 80.0–100.0)
Platelets: 205 10*3/uL (ref 150–400)
RBC: 4.28 MIL/uL (ref 3.87–5.11)
RDW: 11.6 % (ref 11.5–15.5)
WBC: 6.5 10*3/uL (ref 4.0–10.5)
nRBC: 0 % (ref 0.0–0.2)

## 2023-10-28 LAB — RAPID URINE DRUG SCREEN, HOSP PERFORMED
Amphetamines: NOT DETECTED
Barbiturates: NOT DETECTED
Benzodiazepines: NOT DETECTED
Cocaine: NOT DETECTED
Opiates: NOT DETECTED
Tetrahydrocannabinol: NOT DETECTED

## 2023-10-28 LAB — APTT: aPTT: 27 s (ref 24–36)

## 2023-10-28 LAB — PROTIME-INR
INR: 1 (ref 0.8–1.2)
Prothrombin Time: 13.3 s (ref 11.4–15.2)

## 2023-10-28 LAB — ETHANOL: Alcohol, Ethyl (B): <15 mg/dL (ref <15)

## 2023-10-28 MED ORDER — STROKE: EARLY STAGES OF RECOVERY BOOK: Freq: Once | Status: AC

## 2023-10-28 MED ORDER — ENOXAPARIN SODIUM 40 MG/0.4ML IJ SOSY
40.0000 mg | PREFILLED_SYRINGE | INTRAMUSCULAR | Status: DC
  Administered 2023-10-29: 40 mg via SUBCUTANEOUS
  Filled 2023-10-28: qty 0.4

## 2023-10-28 MED ORDER — ALBUTEROL SULFATE (2.5 MG/3ML) 0.083% IN NEBU: 2.5000 mg | INHALATION_SOLUTION | Freq: Four times a day (QID) | RESPIRATORY_TRACT | Status: DC | PRN

## 2023-10-28 MED ORDER — AMOXICILLIN-POT CLAVULANATE 875-125 MG PO TABS
1.0000 | ORAL_TABLET | Freq: Two times a day (BID) | ORAL | Status: DC
  Administered 2023-10-28 – 2023-10-29 (×2): 1 via ORAL
  Filled 2023-10-28 (×2): qty 1

## 2023-10-28 MED ORDER — CLOPIDOGREL BISULFATE 75 MG PO TABS
75.0000 mg | ORAL_TABLET | Freq: Every day | ORAL | Status: DC
  Administered 2023-10-28 – 2023-10-29 (×2): 75 mg via ORAL
  Filled 2023-10-28 (×2): qty 1

## 2023-10-28 MED ORDER — PANTOPRAZOLE SODIUM 40 MG PO TBEC
40.0000 mg | DELAYED_RELEASE_TABLET | Freq: Every day | ORAL | Status: DC
  Administered 2023-10-29: 40 mg via ORAL
  Filled 2023-10-28: qty 1

## 2023-10-28 MED ORDER — ACETAMINOPHEN 650 MG RE SUPP: 650.0000 mg | Freq: Four times a day (QID) | RECTAL | Status: DC | PRN

## 2023-10-28 MED ORDER — LORAZEPAM 2 MG/ML IJ SOLN
1.0000 mg | Freq: Once | INTRAMUSCULAR | Status: AC | PRN
Start: 2023-10-28 — End: 2023-10-28
  Administered 2023-10-28: 1 mg via INTRAVENOUS
  Filled 2023-10-28: qty 1

## 2023-10-28 MED ORDER — ASPIRIN 81 MG PO TBEC
81.0000 mg | DELAYED_RELEASE_TABLET | Freq: Every day | ORAL | Status: DC
  Administered 2023-10-28 – 2023-10-29 (×2): 81 mg via ORAL
  Filled 2023-10-28 (×2): qty 1

## 2023-10-28 MED ORDER — BUTALBITAL-APAP-CAFFEINE 50-325-40 MG PO TABS: 1.0000 | ORAL_TABLET | Freq: Four times a day (QID) | ORAL | Status: DC | PRN

## 2023-10-28 MED ORDER — SODIUM CHLORIDE 0.9% FLUSH
3.0000 mL | Freq: Two times a day (BID) | INTRAVENOUS | Status: DC
  Administered 2023-10-28 – 2023-10-29 (×2): 3 mL via INTRAVENOUS

## 2023-10-28 MED ORDER — LEVOTHYROXINE SODIUM 88 MCG PO TABS
88.0000 ug | ORAL_TABLET | Freq: Every day | ORAL | Status: DC
  Administered 2023-10-29: 88 ug via ORAL
  Filled 2023-10-28: qty 1

## 2023-10-28 MED ORDER — ACETAMINOPHEN 325 MG PO TABS
650.0000 mg | ORAL_TABLET | Freq: Four times a day (QID) | ORAL | Status: DC | PRN
  Administered 2023-10-29 (×2): 650 mg via ORAL
  Filled 2023-10-28 (×2): qty 2

## 2023-10-28 MED ORDER — ROSUVASTATIN CALCIUM 20 MG PO TABS
40.0000 mg | ORAL_TABLET | Freq: Every day | ORAL | Status: DC
  Administered 2023-10-28: 40 mg via ORAL
  Filled 2023-10-28: qty 2

## 2023-10-28 MED ORDER — IOHEXOL 350 MG/ML SOLN
75.0000 mL | Freq: Once | INTRAVENOUS | Status: AC | PRN
  Administered 2023-10-28: 75 mL via INTRAVENOUS

## 2023-10-28 MED ORDER — FLUTICASONE PROPIONATE 50 MCG/ACT NA SUSP
1.0000 | Freq: Every day | NASAL | Status: DC
  Administered 2023-10-29: 1 via NASAL
  Filled 2023-10-28: qty 16

## 2023-10-28 MED ORDER — ONDANSETRON HCL 4 MG/2ML IJ SOLN
4.0000 mg | Freq: Once | INTRAMUSCULAR | Status: AC
  Administered 2023-10-28: 4 mg via INTRAVENOUS
  Filled 2023-10-28: qty 2

## 2023-10-28 MED ORDER — MORPHINE SULFATE (PF) 4 MG/ML IV SOLN
4.0000 mg | Freq: Once | INTRAVENOUS | Status: AC
  Administered 2023-10-28: 4 mg via INTRAVENOUS
  Filled 2023-10-28: qty 1

## 2023-10-28 MED ORDER — HYDRALAZINE HCL 20 MG/ML IJ SOLN: 10.0000 mg | INTRAMUSCULAR | Status: DC | PRN

## 2023-10-28 NOTE — Consult Note (Signed)
 NEUROLOGY CONSULT NOTE   Date of service: October 28, 2023 Patient Name: Avalin Kent MRN:  [id number] DOB:  08-Jul-1955 Chief Complaint: "CODE STROKE" Requesting Provider: Trish Furl, MD  History of Present Illness  Michelle Kent is Michelle 68 y.o. female with hx of HTN, HLD, hypothyroidism, syncope, anxiety who was brought in by EMS as Michelle code stroke due to overall weakness, not alert or oriented.  Patient was at work and felt dizzy, had Michelle syncopal event, afterwards was weak, confused and EMS was called. Son works with mother and saw her in her usual state of health up until 33. Patient states she then got dizzy and has "felt off with Michelle headache ever since".  On neurology assessment at bridge, patient is alert, interactive, moves all extremities, no focal deficit, no vision deficit.  Patient does endorse decreased sensation right leg.  She was taken for emergent CT head which showed acute infarct left anterior frontal lobe.  Endorses frontal headache. She is not Michelle TNK candidate due to low NIH.  She is not Michelle thrombectomy candidate due to no LVO suspected. Patient does endorse recent increase stress at work and states continued headache.  LKW: 1310 Modified rankin score: 0-Completely asymptomatic and back to baseline post- stroke IV Thrombolysis: No, low NIH EVT: No, no LVO suspected  NIHSS components Score: Comment  1a Level of Conscious 0[x]  1[]  2[]  3[]      1b LOC Questions 0[x]  1[]  2[]       1c LOC Commands 0[x]  1[]  2[]       2 Best Gaze 0[x]  1[]  2[]       3 Visual 0[x]  1[]  2[]  3[]      4 Facial Palsy 0[x]  1[]  2[]  3[]      5a Motor Arm - left 0[x]  1[]  2[]  3[]  4[]  UN[]    5b Motor Arm - Right 0[x]  1[]  2[]  3[]  4[]  UN[]    6a Motor Leg - Left 0[x]  1[]  2[]  3[]  4[]  UN[]    6b Motor Leg - Right 0[x]  1[]  2[]  3[]  4[]  UN[]    7 Limb Ataxia 0[x]  1[]  2[]  UN[]      8 Sensory 0[]  1[x]  2[]  UN[]      9 Best Language 0[x]  1[]  2[]  3[]      10 Dysarthria 0[x]  1[]  2[]  UN[]      11 Extinct. and Inattention 0[x]  1[]  2[]        TOTAL:   1      ROS  Comprehensive ROS performed and pertinent positives documented in HPI   Past History   Past Medical History:  Diagnosis Date   Basal cell carcinoma    back; chest; ankle/lower leg; face   Heart murmur    HLD (hyperlipidemia)    Hypertension    Hypothyroidism    Syncope 08/03/2016    Past Surgical History:  Procedure Laterality Date   ABDOMINAL HYSTERECTOMY  1993   APPENDECTOMY  1976   BACK SURGERY     BASAL CELL CARCINOMA EXCISION Right 06/2010   scapula   BASAL CELL CARCINOMA EXCISION Right 07/2016   chest   CESAREAN SECTION  1986   LAMINECTOMY AND MICRODISCECTOMY THORACIC SPINE Left 10/2007   Left sided T11-12 transthoracic microdiskectomy/notes 09/20/2010   MOHS SURGERY Right 2012; 2017   ankle/lower leg; face   TUBAL LIGATION      Family History: Family History  Problem Relation Age of Onset   Lung cancer Mother    Hypertension Mother    Breast cancer Maternal Grandmother 39   Breast cancer Cousin  Ovarian cancer Neg Hx     Social History  reports that she has never smoked. She has never used smokeless tobacco. She reports current alcohol use. She reports that she does not use drugs.  Allergies  Allergen Reactions   Nystatin Anaphylaxis   Codeine Nausea And Vomiting    Medications  No current facility-administered medications for this encounter.  Current Outpatient Medications:    acetaminophen  (TYLENOL ) 500 MG tablet, Take 1,000 mg by mouth every 6 (six) hours as needed for moderate pain. , Disp: , Rfl:    albuterol  (VENTOLIN  HFA) 108 (90 Base) MCG/ACT inhaler, Inhale 2 puffs into the lungs every 4 (four) hours as needed for wheezing or shortness of breath. Rinse mouth after use, Disp: 6.7 g, Rfl: 0   aspirin  EC 81 MG tablet, Take 81 mg by mouth daily., Disp: , Rfl:    cholecalciferol (VITAMIN D) 400 units TABS tablet, Take 400 Units by mouth daily., Disp: , Rfl:    CRESTOR  40 MG tablet, Take 40 mg by mouth every evening. ,  Disp: , Rfl: 5   Desvenlafaxine ER (PRISTIQ) 50 MG TB24, Take 1 tablet by mouth daily., Disp: , Rfl:    levothyroxine  (SYNTHROID ) 112 MCG tablet, Take 112 mcg by mouth daily., Disp: , Rfl:    lisinopril  (PRINIVIL ,ZESTRIL ) 5 MG tablet, Take 5 mg by mouth at bedtime. , Disp: , Rfl: 5   naproxen  (NAPROSYN ) 500 MG tablet, Take 1 tablet (500 mg total) by mouth 2 (two) times daily., Disp: 30 tablet, Rfl: 0   Omega-3 1000 MG CAPS, Take 1,000 mg by mouth daily., Disp: , Rfl:    pantoprazole  (PROTONIX ) 40 MG tablet, Take 1 tablet (40 mg total) by mouth daily., Disp: 30 tablet, Rfl: 0   Spacer/Aero-Holding Chambers (COMPACT SPACE CHAMBER) DEVI, Use with the Albuterol  Inhaler, Disp: 1 each, Rfl: 0   SYNTHROID  150 MCG tablet, Take 150 mcg by mouth daily. , Disp: , Rfl: 3   tiZANidine  (ZANAFLEX ) 2 MG tablet, Take 1-2 tablets (2-4 mg total) by mouth every 6 (six) hours as needed for muscle spasms., Disp: 30 tablet, Rfl: 0   venlafaxine  XR (EFFEXOR -XR) 37.5 MG 24 hr capsule, Take 1 capsule by mouth daily., Disp: , Rfl:    vitamin C (ASCORBIC ACID) 500 MG tablet, Take 1,000 mg by mouth daily., Disp: , Rfl:   Vitals   Vitals:   10/28/23 1415 10/28/23 1422 10/28/23 1435 10/28/23 1439  BP:  (!) 183/78  (!) 186/85  Pulse:    83  Resp:    14  Temp:   98.1 F (36.7 C) 98.1 F (36.7 C)  TempSrc:   Oral Oral  SpO2:    100%  Weight: 79.6 kg       Body mass index is 31.09 kg/m.   Physical Exam   Constitutional: Appears well-developed and well-nourished.  Psych: Anxious but cooperative Cardiovascular: Normal rate and regular rhythm.  Respiratory: Effort normal, non-labored breathing.    Neurologic Examination   Neuro: Mental Status: Patient is awake, alert, oriented to person, place, month, year, and situation. No signs of aphasia or neglect. Does have some confusion when speaking, but corrects herself and realizes when she has said something incorrect. No issues identifying objects or repeating  sentences.  Cranial Nerves: II: Visual Fields are full. Pupils are equal, round, and reactive to light.   III,IV, VI: EOMI without ptosis or diploplia.  V: Facial sensation is symmetric to temperature VII: Facial movement is symmetric.  VIII:  hearing is intact to voice X: Uvula elevates symmetrically XI: Shoulder shrug is symmetric. XII: tongue is midline without atrophy or fasciculations.  Motor: Tone is normal. Bulk is normal.  Generalized weakness 4+/5 strength was present in all four extremities.  No drift in any extremity.  Sensory: Sensation is decreased RLE Cerebellar: FNF and HKS are slow but intact bilaterally   Labs/Imaging/Neurodiagnostic studies   CBC:  Recent Labs  Lab 2023/11/25 1413 November 25, 2023 1416  WBC  --  6.5  NEUTROABS  --  3.6  HGB 13.9 13.7  HCT 41.0 40.1  MCV  --  93.7  PLT  --  205   Basic Metabolic Panel:  Lab Results  Component Value Date   NA 139 11-25-23   K 3.9 Nov 25, 2023   CO2 25 05/30/2023   GLUCOSE 113 (H) 25-Nov-2023   BUN 13 2023/11/25   CREATININE 0.70 25-Nov-2023   CALCIUM  9.6 05/30/2023   GFRNONAA >60 07/13/2022   GFRAA >60 11/19/2018   Lipid Panel:  Lab Results  Component Value Date   LDLCALC 28 08/04/2016   HgbA1c:  Lab Results  Component Value Date   HGBA1C 5.8 (H) 08/04/2016   Urine Drug Screen: No results found for: "LABOPIA", "COCAINSCRNUR", "LABBENZ", "AMPHETMU", "THCU", "LABBARB"  Alcohol Level No results found for: "ETH" INR  Lab Results  Component Value Date   INR 1.0 2023/11/25   APTT  Lab Results  Component Value Date   APTT 27 11-25-2023   AED levels: No results found for: "PHENYTOIN", "ZONISAMIDE", "LAMOTRIGINE", "LEVETIRACETA"  CT Head without contrast(Personally reviewed): Small focus of acute infarct anterior left frontal lobe, left ACA territory Mild chronic microvascular ischemic changes Possible small remote infarcts bilateral cerebellum Aspects 10   ASSESSMENT   Michelle Kent is Michelle 68 y.o.  female with hx of HTN, HLD, hypothyroidism, syncope, anxiety who was brought in by EMS as Michelle code stroke due to overall weakness, not alert or oriented.  Patient was at work and felt dizzy, had Michelle syncopal event, afterwards was weak, confused and EMS was called. On neurology assessment at bridge, patient is alert, interactive, moves all extremities, no focal deficit, no vision deficit.  Patient does endorse decreased sensation right leg.  She was taken for emergent CT head which showed possible acute infarct left anterior frontal lobe.  Endorses frontal headache.  She is not Michelle TNK candidate due to low NIH.  She is not Michelle thrombectomy candidate due to no LVO suspected. Patient does endorse recent increase stress at work and states continued headache.  Impression: Acute Ischemic Infarct, left ACA territory complicated by increased stress and headache/migraine pain.  RECOMMENDATIONS   - Frequent Neuro checks per stroke unit protocol - MRI Brain stroke protocol - TTE w/bubble study - Lipid panel - Statin - will be started if LDL>70 or otherwise medically indicated - A1C - Antithrombotic - ASA/Plavix for 21 days, likely followed by Plavix monotherapy as patient was on aspirin  at home PTA - DVT ppx -okay for Lovenox  - SBP goal - <220 permissive hypertension for first 24 hours since symptom onset, then gradually normalize.  PRN labetalol if HR>60 and PRN Hydralazine if HR<60 - Telemetry monitoring for arrhythmia - Swallow screen - will be performed prior to PO intake - Stroke education - will be given - PT/OT/SLP - Migraine cocktail - Syncopal workup   - Dispo: Admit for full stroke workup  Stroke team will follow beginning 6/9 AM  ______________________________________________________________________    Signed, Audrene Lease, NP [facility]    Attending Neurohospitalist Addendum Patient seen and examined with APP/Resident. Agree with the history and physical as documented  above. Agree with the plan as documented, which I helped formulate. I have edited the note above to reflect my full findings and recommendations. I have independently reviewed the chart, obtained history, review of systems and examined the patient.I have personally reviewed pertinent head/neck/spine imaging (CT/MRI). Please feel free to call with any questions.  -- Greg Leaks, MD [facility] [phone]  If 7pm- 7am, please page neurology on call as listed in AMION.

## 2023-10-28 NOTE — ED Notes (Signed)
 PT transported to MRI. Husband accompanied bedside.

## 2023-10-28 NOTE — ED Provider Notes (Signed)
 [place] [facility] AT [facility] Provider Note   CSN: [id number] Arrival date & time: 10/28/23  1407     History   Michelle Kent is a 68 y.o. female.  HPI   Patient has a history of chest pain hypertension hyperlipidemia hypothyroidism.  Patient was at work today.  She started to feel lightheaded.  Patient then had a syncopal episode.  Patient states she subsequent developed a headache.  EMS noted the patient was having difficulty speaking.  She was confused.  She was not able to identify objects and she was having difficulty with her coordination.  A stroke code stroke was activated.  Patient is now able to speak to me.  She reports feeling weak all over.  She still has a headache  Home Medications Prior to Admission medications   Medication Sig Start Date End Date Taking? Authorizing Provider  acetaminophen  (TYLENOL ) 500 MG tablet Take 1,000 mg by mouth every 6 (six) hours as needed for moderate pain.     [provider]  albuterol  (VENTOLIN  HFA) 108 (90 Base) MCG/ACT inhaler Inhale 2 puffs into the lungs every 4 (four) hours as needed for wheezing or shortness of breath. Rinse mouth after use 05/30/23 06/29/23  Guss Legacy, FNP  aspirin  EC 81 MG tablet Take 81 mg by mouth daily.    [provider]  cholecalciferol (VITAMIN D) 400 units TABS tablet Take 400 Units by mouth daily.    [provider]  CRESTOR  40 MG tablet Take 40 mg by mouth every evening.  06/17/14   [provider]  Desvenlafaxine ER (PRISTIQ) 50 MG TB24 Take 1 tablet by mouth daily. 02/24/20   [provider]  levothyroxine  (SYNTHROID ) 112 MCG tablet Take 112 mcg by mouth daily. 06/05/20   [provider]  lisinopril  (PRINIVIL ,ZESTRIL ) 5 MG tablet Take 5 mg by mouth at bedtime.  06/17/14   [provider]  naproxen  (NAPROSYN ) 500 MG tablet Take 1 tablet (500 mg total) by mouth 2 (two) times daily. 10/25/20   Wieters, Hallie C, PA-C  Omega-3  1000 MG CAPS Take 1,000 mg by mouth daily.    [provider]  pantoprazole  (PROTONIX ) 40 MG tablet Take 1 tablet (40 mg total) by mouth daily. 07/13/22   Tonya Fredrickson, MD  Spacer/Aero-Holding Chambers (COMPACT SPACE CHAMBER) DEVI Use with the Albuterol  Inhaler 05/30/23   Guss Legacy, FNP  SYNTHROID  150 MCG tablet Take 150 mcg by mouth daily.  06/30/14   [provider]  tiZANidine  (ZANAFLEX ) 2 MG tablet Take 1-2 tablets (2-4 mg total) by mouth every 6 (six) hours as needed for muscle spasms. 10/25/20   Wieters, Hallie C, PA-C  venlafaxine  XR (EFFEXOR -XR) 37.5 MG 24 hr capsule Take 1 capsule by mouth daily. 07/02/14   [provider]  vitamin C (ASCORBIC ACID) 500 MG tablet Take 1,000 mg by mouth daily.    [provider]      Allergies    Nystatin and Codeine    Review of Systems   Review of Systems  Physical Exam Updated Vital Signs BP (!) 186/85 (BP Location: Right Arm)   Pulse 83   Temp 98.1 F (36.7 C) (Oral)   Resp 14   Wt 79.6 kg   SpO2 100%   BMI 31.09 kg/m  Physical Exam Vitals and nursing note reviewed.  Constitutional:      Appearance: She is well-developed. She is not diaphoretic.  HENT:     Head: Normocephalic and  atraumatic.     Right Ear: External ear normal.     Left Ear: External ear normal.  Eyes:     General: No scleral icterus.       Right eye: No discharge.        Left eye: No discharge.     Conjunctiva/sclera: Conjunctivae normal.  Neck:     Trachea: No tracheal deviation.  Cardiovascular:     Rate and Rhythm: Normal rate and regular rhythm.  Pulmonary:     Effort: Pulmonary effort is normal. No respiratory distress.     Breath sounds: Normal breath sounds. No stridor. No wheezing or rales.  Abdominal:     General: Bowel sounds are normal. There is no distension.     Palpations: Abdomen is soft.     Tenderness: There is no abdominal tenderness. There is no guarding or rebound.  Musculoskeletal:        General:  No tenderness or deformity.     Cervical back: Neck supple.  Skin:    General: Skin is warm and dry.     Findings: No rash.  Neurological:     Mental Status: She is alert.     Cranial Nerves: No cranial nerve deficit, dysarthria or facial asymmetry.     Sensory: No sensory deficit.     Motor: No abnormal muscle tone or seizure activity.  Psychiatric:        Mood and Affect: Mood normal.     ED Results / Procedures / Treatments   Labs (all labs ordered are listed, but only abnormal results are displayed) Labs Reviewed  COMPREHENSIVE METABOLIC PANEL WITH GFR - Abnormal; Notable for the following components:      Result Value   Glucose, Bld 112 (*)    All other components within normal limits  I-STAT CHEM 8, ED - Abnormal; Notable for the following components:   Glucose, Bld 113 (*)    All other components within normal limits  ETHANOL  PROTIME-INR  APTT  CBC  DIFFERENTIAL  RAPID URINE DRUG SCREEN, HOSP PERFORMED  I-STAT CHEM 8, ED    EKG EKG Interpretation Date/Time:  Sunday October 28 2023 14:39:31 EDT Ventricular Rate:  79 PR Interval:  184 QRS Duration:  90 QT Interval:  399 QTC Calculation: 458 R Axis:   -24  Text Interpretation: Sinus rhythm Borderline left axis deviation Anteroseptal infarct, old No significant change since last tracing Confirmed by Trish Furl [id number]) on 10/28/2023 3:12:17 PM  Radiology CT Cervical Spine Wo Contrast Result Date: 10/28/2023 CLINICAL DATA:  Trauma, sudden onset headache, fall. EXAM: CT CERVICAL SPINE WITHOUT CONTRAST TECHNIQUE: Multidetector CT imaging of the cervical spine was performed without intravenous contrast. Multiplanar CT image reconstructions were also generated. RADIATION DOSE REDUCTION: This exam was performed according to the departmental dose-optimization program which includes automated exposure control, adjustment of the mA and/or kV according to patient size and/or use of iterative reconstruction technique. COMPARISON:   Same-day head CT. FINDINGS: Alignment: Straightening of the normal cervical lordosis. No significant listhesis. No facet subluxation or dislocation. Skull base and vertebrae: No compression fracture or displaced fracture in the cervical spine. No suspicious osseous lesion. Prominent degenerative endplate osteophytes particularly from C4-C7. Soft tissues and spinal canal: No prevertebral fluid or swelling. No visible canal hematoma. Disc levels: Mild intervertebral disc space narrowing at C5-6 and C6-7. Small disc bulges at multiple levels. There is mild spinal canal stenosis at C4-5. Disc osteophyte complexes at C5-6 and C6-7 resulting in mild spinal canal  stenosis. Facet arthrosis and uncovertebral hypertrophy throughout the cervical spine. Foraminal narrowing is most pronounced on the right at C3-4. Upper chest: No acute abnormality. Other: None. IMPRESSION: No acute fracture or traumatic malalignment of the cervical spine. Degenerative changes as above. Electronically Signed   By: Denny Flack M.D.   On: 10/28/2023 14:43   CT HEAD CODE STROKE WO CONTRAST Result Date: 10/28/2023 CLINICAL DATA:  Code stroke.  Neuro deficit, concern for stroke. EXAM: CT HEAD WITHOUT CONTRAST TECHNIQUE: Contiguous axial images were obtained from the base of the skull through the vertex without intravenous contrast. RADIATION DOSE REDUCTION: This exam was performed according to the departmental dose-optimization program which includes automated exposure control, adjustment of the mA and/or kV according to patient size and/or use of iterative reconstruction technique. COMPARISON:  None Available. FINDINGS: Brain: No acute intracranial hemorrhage. Hypoattenuation and loss of gray-white matter differentiation within the anterior left frontal lobe within the left ACA territory for example on series 3, images 18 and 19. No significant edema, mass effect, or midline shift. Nonspecific hypoattenuation in the periventricular and  subcortical white matter favored to reflect chronic microvascular ischemic changes. Possible small remote infarcts in the bilateral cerebellum. The basilar cisterns are patent. Ventricles: The ventricles are normal. Vascular: Atherosclerotic calcifications of the carotid siphons. No hyperdense vessel. Skull: No acute or aggressive finding. Orbits: Orbits are symmetric. Sinuses: Mucosal thickening and partial opacification of the right sphenoid sinus. Other: Trace fluid in the left mastoid air cells. ASPECTS [facility] Stroke Program Early CT Score) - Ganglionic level infarction (caudate, lentiform nuclei, internal capsule, insula, M1-M3 cortex): 7 - Supraganglionic infarction (M4-M6 cortex): 3 Total score (0-10 with 10 being normal): 10 IMPRESSION: 1. Small focus of acute infarct in the anterior left frontal lobe within the left ACA territory. 2. Mild chronic microvascular ischemic changes. There are possible small remote infarcts in the bilateral cerebellum. 3. Right sphenoid sinus disease. Recommend correlation for acute sinusitis. 4. ASPECTS is 10 These results were communicated to Dr. Doretta Gant At 2:30 pm on 10/28/2023 by text page via the [facility] messaging system. Electronically Signed   By: Denny Flack M.D.   On: 10/28/2023 14:30    Procedures Procedures    Medications Ordered in ED Medications  morphine  (PF) 4 MG/ML injection 4 mg (4 mg Intravenous Given 10/28/23 1504)  ondansetron  (ZOFRAN ) injection 4 mg (4 mg Intravenous Given 10/28/23 1503)    ED Course/ Medical Decision Making/ A&P Clinical Course as of 10/28/23 1540  Sun Oct 28, 2023  1500 C-spine CT without acute changes [JK]  1508 Head CT shows a small focus of acute infarct in the anterior left frontal lobe [JK]  1508 CBC and metabolic panel unremarkable. [JK]  1540 Case discussed with Dr. Felipe Horton regarding admission [JK]    Clinical Course User Index [JK] Trish Furl, MD                                 Medical Decision Making Problems  Addressed: Cerebrovascular accident (CVA), unspecified mechanism [facility]): acute illness or injury that poses a threat to life or bodily functions Syncope, unspecified syncope type: acute illness or injury that poses a threat to life or bodily functions  Amount and/or Complexity of Data Reviewed Labs: ordered. Decision-making details documented in ED Course. Radiology: ordered and independent interpretation performed.  Risk Prescription drug management. Decision regarding hospitalization.   Patient presented to the ED for evaluation of syncope headache  confusion speech difficulties.  Presentation concerning for possible acute stroke.  Patient was activated as a code stroke by EMS and seen by the stroke team on arrival.  Patient's symptoms are improving.  Not felt to be a thrombolytic candidate.  Patient's ED workup without signs of cardiac dysrhythmia.  No anemia no electrolyte abnormalities.  No signs of cervical spine injury on her CT scan.  Patient head CT does show show probable acute infarct left anterior frontal lobe.  Will plan on admission to the hospital for further treatment ration.        Final Clinical Impression(s) / ED Diagnoses Final diagnoses:  Cerebrovascular accident (CVA), unspecified mechanism (HCC)  Syncope, unspecified syncope type    Rx / DC Orders ED Discharge Orders     None         Trish Furl, MD 10/28/23 1540

## 2023-10-28 NOTE — ED Notes (Signed)
 Patient transported to CT

## 2023-10-28 NOTE — H&P (Addendum)
 History and Physical    Patient: Michelle Kent ZOX:[id number] DOB: [birth year] DOA: 10/28/2023 DOS: the patient was seen and examined on 10/28/2023 PCP: Faustina Hood, MD  Patient coming from: Work via EMS  Chief Complaint: Syncope  HPI: Michelle Kent is a 68 y.o. female with medical history significant of hypertension, hyperlipidemia, and hypothyroidism presents with dizziness and confusion after fainting episode at work.  She experienced dizziness at work, followed by a sensation of something shooting up into her head, and then believes she passed out. Upon regaining consciousness, she felt confused and fuzzy. She recalls falling on her left arm during the episode.  She describes a severe headache located on the top of her head, feeling like she is 'on fire'. She also reports feeling generally weak and nauseous. She mentions that she was given medication for her headache, but the pain persists.  She has been experiencing significant stress related to family issues, particularly with her children and grandchildren.  No leg swelling, calf pain, or prolonged sitting, noting that she stands for long periods at work. No weakness on one side of her body, describing a general feeling of weakness instead.  She has a history of thyroid  issues and is currently taking levothyroxine  regularly. She mentions having difficulty with swallowing during a swallow study, feeling nauseous but not having a problem swallowing.  Initially had difficulty getting her words out and felt confused, but reports that this has improved slightly.   In the emergency department patient presented as a code stroke.  Blood pressures noted to be elevated up to 186/85.  CT scan of the brain concerning for an acute infarct in the anterior left frontal lobe within the left MCA territory as well as right sphenoid sinus disease.  Patient was not a thrombolytics candidate due to being outside the window.  Labs were relatively unremarkable.   Neurology recommended admission for completion of stroke workup.  Review of Systems: As mentioned in the history of present illness. All other systems reviewed and are negative. Past Medical History:  Diagnosis Date   Basal cell carcinoma    back; chest; ankle/lower leg; face   Heart murmur    HLD (hyperlipidemia)    Hypertension    Hypothyroidism    Syncope 08/03/2016   Past Surgical History:  Procedure Laterality Date   ABDOMINAL HYSTERECTOMY  1993   APPENDECTOMY  1976   BACK SURGERY     BASAL CELL CARCINOMA EXCISION Right 06/2010   scapula   BASAL CELL CARCINOMA EXCISION Right 07/2016   chest   CESAREAN SECTION  1986   LAMINECTOMY AND MICRODISCECTOMY THORACIC SPINE Left 10/2007   Left sided T11-12 transthoracic microdiskectomy/notes 09/20/2010   MOHS SURGERY Right 2012; 2017   ankle/lower leg; face   TUBAL LIGATION     Social History:  reports that she has never smoked. She has never used smokeless tobacco. She reports current alcohol use. She reports that she does not use drugs.  Allergies  Allergen Reactions   Nystatin Anaphylaxis   Codeine Nausea And Vomiting    Family History  Problem Relation Age of Onset   Lung cancer Mother    Hypertension Mother    Breast cancer Maternal Grandmother 69   Breast cancer Cousin    Ovarian cancer Neg Hx     Prior to Admission medications   Medication Sig Start Date End Date Taking? Authorizing Provider  acetaminophen  (TYLENOL ) 500 MG tablet Take 1,000 mg by mouth every 6 (six) hours as needed for moderate  pain.     [provider]  albuterol  (VENTOLIN  HFA) 108 (90 Base) MCG/ACT inhaler Inhale 2 puffs into the lungs every 4 (four) hours as needed for wheezing or shortness of breath. Rinse mouth after use 05/30/23 06/29/23  Guss Legacy, FNP  aspirin  EC 81 MG tablet Take 81 mg by mouth daily.    [provider]  cholecalciferol (VITAMIN D) 400 units TABS tablet Take 400 Units by mouth daily.    [provider]  CRESTOR  40 MG tablet Take 40 mg by mouth every evening.  06/17/14   [provider]  Desvenlafaxine ER (PRISTIQ) 50 MG TB24 Take 1 tablet by mouth daily. 02/24/20   [provider]  levothyroxine  (SYNTHROID ) 112 MCG tablet Take 112 mcg by mouth daily. 06/05/20   [provider]  lisinopril  (PRINIVIL ,ZESTRIL ) 5 MG tablet Take 5 mg by mouth at bedtime.  06/17/14   [provider]  naproxen  (NAPROSYN ) 500 MG tablet Take 1 tablet (500 mg total) by mouth 2 (two) times daily. 10/25/20   Wieters, Hallie C, PA-C  Omega-3 1000 MG CAPS Take 1,000 mg by mouth daily.    [provider]  pantoprazole  (PROTONIX ) 40 MG tablet Take 1 tablet (40 mg total) by mouth daily. 07/13/22   Tonya Fredrickson, MD  Spacer/Aero-Holding Chambers (COMPACT SPACE CHAMBER) DEVI Use with the Albuterol  Inhaler 05/30/23   Guss Legacy, FNP  SYNTHROID  150 MCG tablet Take 150 mcg by mouth daily.  06/30/14   [provider]  tiZANidine  (ZANAFLEX ) 2 MG tablet Take 1-2 tablets (2-4 mg total) by mouth every 6 (six) hours as needed for muscle spasms. 10/25/20   Wieters, Hallie C, PA-C  venlafaxine  XR (EFFEXOR -XR) 37.5 MG 24 hr capsule Take 1 capsule by mouth daily. 07/02/14   [provider]  vitamin C (ASCORBIC ACID) 500 MG tablet Take 1,000 mg by mouth daily.    [provider]    Physical Exam: Vitals:   10/28/23 1415 10/28/23 1422 10/28/23 1435 10/28/23 1439  BP:  (!) 183/78  (!) 186/85  Pulse:    83  Resp:    14  Temp:   98.1 F (36.7 C) 98.1 F (36.7 C)  TempSrc:   Oral Oral  SpO2:    100%  Weight: 79.6 kg      Constitutional: Elderly female who appears to be in some distress but able to follow commands Eyes: PERRL, lids and conjunctivae normal ENMT: Mucous membranes are moist.  Normal dentition.  Neck: normal, supple  Respiratory: clear to auscultation bilaterally, no wheezing, no crackles. Normal respiratory effort. No accessory muscle use.   Cardiovascular: Regular rate and rhythm, no murmurs / rubs / gallops. No extremity edema.  Abdomen: no tenderness, no masses palpated.   Bowel sounds positive.  Musculoskeletal: no clubbing / cyanosis. No joint deformity upper and lower extremities. Good ROM, no contractures. Normal muscle tone.  Skin: no rashes, lesions, ulcers. Abrasion to left arm Neurologic: CN 2-12 grossly intact.  Strength 5/5 in all 4.   Psychiatric: Normal judgment and insight.  Alert and oriented to person and place.  Data Reviewed:  EKG reveals sinus rhythm at 79 bpm with borderline left axis deviation.  Reviewed labs, imaging, and pertinent records as documented.  Assessment and Plan:  Syncopal episode  Suspected TIA/stroke Patient presents after having a syncopal episode at work.  Reports that it was hot and she has been having a lot of stressors.  With reports of frontal headache.  Initial CT noted concern for a possible acute infarct of the left anterior frontal lobe.  Patient was not a thrombolytics candidate due to low NIH.  CT angiogram of the head did not note any signs for a large vessel occlusion.  Neurology recommending admission for completion of stroke workup. - Admit to a telemetry bed  - Neurochecks - Check echocardiogram - Check MRI of the brain - PT/OT to eval and treat - Aspirin  and Plavix - Follow-up telemetry overnight and to ensure no arrhythmia.  May warrant going home with Holter monitor  Possible sinusitis Noted on imaging studies.  Patient had been sneezing and intermittent coughing thought possibly secondary to allergies. - Augmentin  - Flonase  Headache Patient reports having significant frontal headache. - Fioricet as needed  Essential hypertension Home blood pressure regimen includes lisinopril  5 mg nightly - Allowing for permissive hypertension at this time.  Treating only systolic blood pressures greater than 220 or diastolic greater than 110  Hypothyroidism Of note  patient is on biotin which may affect results. - Check TSH - Continue levothyroxine   Hyperlipidemia - Follow-up lipid panel - Continue Crestor .  Adjust dose if needed with goal LDL less than 70  DVT prophylaxis: Lovenox   Advance Care Planning:   Code Status: Limited: Do not attempt resuscitation (DNR) -DNR-LIMITED -Do Not Intubate/DNI     Consults: Neurology  Family Communication: Husband updated at bedside  Severity of Illness: The appropriate patient status for this patient is OBSERVATION. Observation status is judged to be reasonable and necessary in order to provide the required intensity of service to ensure the patient's safety. The patient's presenting symptoms, physical exam findings, and initial radiographic and laboratory data in the context of their medical condition is felt to place them at decreased risk for further clinical deterioration. Furthermore, it is anticipated that the patient will be medically stable for discharge from the hospital within 2 midnights of admission.   Author: Lena Qualia, MD 10/28/2023 3:37 PM  For on call review [email or web address].

## 2023-10-28 NOTE — ED Notes (Signed)
CBG 110. 

## 2023-10-28 NOTE — Code Documentation (Signed)
 Stroke Response Nurse Documentation Code Documentation  Michelle Kent is a 69 y.o. female arriving to [facility]  via [facility] on 10-28-23 with past medical hx of HTN. On aspirin  81 mg daily. Code stroke was activated by EMS.   Patient from home where she was LKW at 1310 at work today and now complaining of feeling confused and having a headache.    At work she became dizzy then had a syncopal episode and fell.  Stroke team at the bedside on patient arrival. Labs drawn and patient cleared for CT by Dr. Monnie Anthony. Patient to CT with team. NIHSS 8, see documentation for details and code stroke times. Patient with disoriented, not following commands, bilateral arm weakness, bilateral leg weakness, and right decreased sensation on exam. The following imaging was completed:  CT Head. Patient is not a candidate for IV Thrombolytic due to symptoms mild and improving. Patient is not a candidate for IR due to no LVO suspected.   Care Plan: VS and NIHSS q 2 hours x 12 hours.   Bedside handoff with ED RN [place].    Waldemar Guillaume  Stroke Response RN

## 2023-10-28 NOTE — ED Notes (Signed)
 Son [place] phone number [phone]

## 2023-10-28 NOTE — ED Triage Notes (Addendum)
 Pt BIB [facility] from work due to code stroke.  Patient was talking with son on the phone when patient had syncopal episode.  [facility] C-collar in place.  Patient was very confused on scene and was unable to speak normally. Unable to identify objects and lack of coordination.  18g left forearm. VS BP 194/85, HR 74

## 2023-10-29 ENCOUNTER — Other Ambulatory Visit: Payer: Self-pay

## 2023-10-29 ENCOUNTER — Observation Stay (HOSPITAL_BASED_OUTPATIENT_CLINIC_OR_DEPARTMENT_OTHER)

## 2023-10-29 ENCOUNTER — Encounter (HOSPITAL_COMMUNITY): Payer: Self-pay | Admitting: Internal Medicine

## 2023-10-29 DIAGNOSIS — I10 Essential (primary) hypertension: Secondary | ICD-10-CM

## 2023-10-29 DIAGNOSIS — E669 Obesity, unspecified: Secondary | ICD-10-CM

## 2023-10-29 DIAGNOSIS — F1090 Alcohol use, unspecified, uncomplicated: Secondary | ICD-10-CM | POA: Diagnosis not present

## 2023-10-29 DIAGNOSIS — E785 Hyperlipidemia, unspecified: Secondary | ICD-10-CM

## 2023-10-29 DIAGNOSIS — G9340 Encephalopathy, unspecified: Secondary | ICD-10-CM

## 2023-10-29 DIAGNOSIS — R55 Syncope and collapse: Secondary | ICD-10-CM | POA: Diagnosis not present

## 2023-10-29 DIAGNOSIS — Z6831 Body mass index (BMI) 31.0-31.9, adult: Secondary | ICD-10-CM

## 2023-10-29 LAB — LIPID PANEL
Cholesterol: 127 mg/dL (ref 0–200)
HDL: 48 mg/dL
LDL Cholesterol: 44 mg/dL (ref 0–99)
Total CHOL/HDL Ratio: 2.6 ratio
Triglycerides: 176 mg/dL — ABNORMAL HIGH
VLDL: 35 mg/dL (ref 0–40)

## 2023-10-29 LAB — ECHOCARDIOGRAM COMPLETE
Area-P 1/2: 3.72 cm2
Height: 63 in
S' Lateral: 2.9 cm
Weight: 2807.78 [oz_av]

## 2023-10-29 LAB — CBC
HCT: 38.9 % (ref 36.0–46.0)
Hemoglobin: 12.9 g/dL (ref 12.0–15.0)
MCH: 31.5 pg (ref 26.0–34.0)
MCHC: 33.2 g/dL (ref 30.0–36.0)
MCV: 95.1 fL (ref 80.0–100.0)
Platelets: 191 10*3/uL (ref 150–400)
RBC: 4.09 MIL/uL (ref 3.87–5.11)
RDW: 11.8 % (ref 11.5–15.5)
WBC: 6.4 10*3/uL (ref 4.0–10.5)
nRBC: 0 % (ref 0.0–0.2)

## 2023-10-29 LAB — BASIC METABOLIC PANEL WITH GFR
Anion gap: 8 (ref 5–15)
BUN: 10 mg/dL (ref 8–23)
CO2: 26 mmol/L (ref 22–32)
Calcium: 9 mg/dL (ref 8.9–10.3)
Chloride: 104 mmol/L (ref 98–111)
Creatinine, Ser: 0.83 mg/dL (ref 0.44–1.00)
GFR, Estimated: >60 mL/min (ref >60)
Glucose, Bld: 130 mg/dL — ABNORMAL HIGH (ref 70–99)
Potassium: 3.9 mmol/L (ref 3.5–5.1)
Sodium: 138 mmol/L (ref 135–145)

## 2023-10-29 LAB — TSH: TSH: 12.808 u[IU]/mL — ABNORMAL HIGH (ref 0.350–4.500)

## 2023-10-29 LAB — HIV ANTIBODY (ROUTINE TESTING W REFLEX): HIV Screen 4th Generation wRfx: NONREACTIVE

## 2023-10-29 LAB — HEMOGLOBIN A1C
Hgb A1c MFr Bld: 6.9 % — ABNORMAL HIGH (ref 4.8–5.6)
Mean Plasma Glucose: 151 mg/dL

## 2023-10-29 MED ORDER — AMOXICILLIN-POT CLAVULANATE 875-125 MG PO TABS: 1.0000 | ORAL_TABLET | Freq: Two times a day (BID) | ORAL | 0 refills | Status: AC

## 2023-10-29 MED ORDER — LIVING WELL WITH DIABETES BOOK
Freq: Once | Status: AC
  Filled 2023-10-29: qty 1

## 2023-10-29 MED ORDER — FLUTICASONE PROPIONATE 50 MCG/ACT NA SUSP: 1.0000 | Freq: Every day | NASAL | 0 refills | Status: AC

## 2023-10-29 MED ORDER — BUTALBITAL-APAP-CAFFEINE 50-325-40 MG PO TABS: 1.0000 | ORAL_TABLET | Freq: Four times a day (QID) | ORAL | 0 refills | Status: AC | PRN

## 2023-10-29 NOTE — Progress Notes (Signed)
 Echocardiogram 2D Echocardiogram has been performed.  Farley Honer, RDCS 10/29/2023, 11:03 AM

## 2023-10-29 NOTE — Hospital Course (Addendum)
 Michelle Kent is a 68 y.o. female with past medical history significant of hypertension, hyperlipidemia, and hypothyroidism presented to hospital with dizziness confusion after fainting at work.  He also described severe headache located on the top of her head, feeling like she is 'on fire'.  Also complained of lateralized weakness and nausea. She has been experiencing significant stress related to family issues, particularly with her children and grandchildren.  She initially also had difficulty getting her words out and felt confused. In the emergency department patient presented as a code stroke.  Blood pressures noted to be elevated up to 186/85.  CT scan of the brain concerning for an acute infarct in the anterior left frontal lobe within the left MCA territory as well as right sphenoid sinus disease.  Patient was not a thrombolytics candidate due to being outside the window.  Labs were relatively unremarkable.  Neurology recommended admission for completion of stroke workup.  Assessment and plan.  Syncopal episode  Suspected TIA/stroke   Initial CT noted concern for a possible acute infarct of the left anterior frontal lobe.  Patient was not a thrombolytics candidate due to low NIH.  CT angiogram of the head did not note any signs for a large vessel occlusion.  Neurology recommending admission for completion of stroke workup.  MRI of the brain did not show any evidence of stroke.  Continue telemetry monitoring.  Pending 2D echocardiogram.  PT OT evaluation.  Continue aspirin  and Plavix.   Possible sinusitis On Flonase and Augmentin   Headache Patient reports having significant frontal headache.  Continue Sudafed - Fioricet as needed   Essential hypertension On lisinopril  5 mg at home.  Currently on permissive hypertension.   Hypothyroidism On Biotene.  TSH elevated.  On thyroid  supplements   Hyperlipidemia Continue Crestor .  Check lipid panel.

## 2023-10-29 NOTE — Progress Notes (Addendum)
 STROKE TEAM PROGRESS NOTE   INTERIM HISTORY/SUBJECTIVE  Family at the bedside.  Patient is working with physical therapy and walking up and down the hall in no apparent distress She complains of mild headache today.  She says yesterday she had passed out and she was confused when she woke up Check orthostatics vitals today  CBC    Component Value Date/Time   WBC 6.4 10/29/2023 0526   RBC 4.09 10/29/2023 0526   HGB 12.9 10/29/2023 0526   HGB 14.2 05/30/2023 0900   HCT 38.9 10/29/2023 0526   HCT 42.8 05/30/2023 0900   PLT 191 10/29/2023 0526   PLT 302 05/30/2023 0900   MCV 95.1 10/29/2023 0526   MCV 94 05/30/2023 0900   MCH 31.5 10/29/2023 0526   MCHC 33.2 10/29/2023 0526   RDW 11.8 10/29/2023 0526   RDW 12.0 05/30/2023 0900   LYMPHSABS 2.3 10/28/2023 1416   LYMPHSABS 3.5 (H) 05/30/2023 0900   MONOABS 0.5 10/28/2023 1416   EOSABS 0.1 10/28/2023 1416   EOSABS 0.1 05/30/2023 0900   BASOSABS 0.0 10/28/2023 1416   BASOSABS 0.1 05/30/2023 0900    BMET    Component Value Date/Time   NA 138 10/29/2023 0526   NA 140 05/30/2023 0900   K 3.9 10/29/2023 0526   CL 104 10/29/2023 0526   CO2 26 10/29/2023 0526   GLUCOSE 130 (H) 10/29/2023 0526   BUN 10 10/29/2023 0526   BUN 10 05/30/2023 0900   CREATININE 0.83 10/29/2023 0526   CALCIUM  9.0 10/29/2023 0526   EGFR 83 05/30/2023 0900   GFRNONAA >60 10/29/2023 0526    IMAGING past 24 hours MR BRAIN WO CONTRAST Result Date: 10/28/2023 EXAM: MRI BRAIN WITHOUT CONTRAST 10/28/2023 04:58:28 PM TECHNIQUE: Multiplanar multisequence MRI of the head/brain was performed without the administration of intravenous contrast. COMPARISON: CT head without contrast 10/28/2023 at 2:24 PM. CLINICAL HISTORY: Stroke, follow up. FINDINGS: BRAIN AND VENTRICLES: No acute infarct. No intracranial hemorrhage. No mass. No midline shift. No hydrocephalus. The sella is unremarkable. Normal flow voids. ORBITS: No acute abnormality. SINUSES AND MASTOIDS: A fluid  level is present within the right sphenoid sinus. A small left mastoid effusion is present. No obstructing nasopharyngeal lesion is present. BONES AND SOFT TISSUES: Normal marrow signal. No acute soft tissue abnormality. IMPRESSION: 1. No acute intracranial abnormality related to the stroke. 2. Fluid level within the right sphenoid sinus may represent acute infection 3. small left mastoid effusion. No obstructing nasopharyngeal lesion. Electronically signed by: Audree Leas MD 10/28/2023 05:20 PM EDT RP Workstation: [id number]   CT ANGIO HEAD NECK W WO CM Result Date: 10/28/2023 CLINICAL DATA:  Stroke/TIA, determine embolic source.  Headache. EXAM: CT ANGIOGRAPHY HEAD AND NECK WITH AND WITHOUT CONTRAST TECHNIQUE: Multidetector CT imaging of the head and neck was performed using the standard protocol during bolus administration of intravenous contrast. Multiplanar CT image reconstructions and MIPs were obtained to evaluate the vascular anatomy. Carotid stenosis measurements (when applicable) are obtained utilizing NASCET criteria, using the distal internal carotid diameter as the denominator. RADIATION DOSE REDUCTION: This exam was performed according to the departmental dose-optimization program which includes automated exposure control, adjustment of the mA and/or kV according to patient size and/or use of iterative reconstruction technique. CONTRAST:  75mL OMNIPAQUE  IOHEXOL  350 MG/ML SOLN COMPARISON:  Same day CT head. FINDINGS: CTA NECK FINDINGS Aortic arch: Four vessel configuration of the aortic arch. Imaged portion shows no evidence of aneurysm or dissection. Mild atherosclerosis. No significant stenosis of the major arch  vessel origins. Pulmonary arteries: As permitted by contrast timing, there are no filling defects in the visualized pulmonary arteries. Subclavian arteries: The subclavian arteries are patent bilaterally. Right carotid system: Patent. Mild atherosclerosis at the carotid bifurcation  without hemodynamically significant stenosis. No evidence of dissection. Left carotid system: Patent. Mild atherosclerosis at the carotid bifurcation without hemodynamically significant stenosis. No evidence of dissection. Vertebral arteries: Codominant. No evidence of dissection, stenosis (50% or greater), or occlusion. Skeleton: No acute findings. Degenerative changes in the cervical spine. 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 Mild spinal canal stenosis in the lower cervical spine. Other neck: The visualized airway is patent. No cervical lymphadenopathy. Upper chest: Visualized lung apices are clear. Review of the MIP images confirms the above findings CTA HEAD FINDINGS ANTERIOR CIRCULATION: The intracranial ICAs are patent bilaterally. Mild atherosclerosis of the right carotid siphon without significant stenosis. There is a 2 mm inferiorly directed outpouching along the right supraclinoid ICA. No significant stenosis, proximal occlusion, or vascular malformation. MCAs: The middle cerebral arteries are patent bilaterally. ACAs: The anterior cerebral arteries are patent bilaterally. POSTERIOR CIRCULATION: No significant stenosis, proximal occlusion, aneurysm, or vascular malformation. PCAs: The posterior cerebral arteries are patent bilaterally. Pcomm: Not well visualized. SCAs: The superior cerebellar arteries are patent bilaterally. Basilar artery: Patent AICAs: Small vessel visualized on the right. PICAs: Visualized on the left. Vertebral arteries: The intracranial vertebral arteries are patent. Venous sinuses: As permitted by contrast timing, patent. Anatomic variants: None Review of the MIP images confirms the above findings IMPRESSION: No large vessel occlusion. No lesion amenable to acute neurovascular intervention. Mild atherosclerosis as above. 2 mm inferiorly directed outpouching along the right supraclinoid ICA which  may reflect an infundibulum versus small aneurysm. Aortic Atherosclerosis (ICD10-I70.0). Electronically Signed   By: Denny Flack M.D.   On: 10/28/2023 16:43   CT Cervical Spine Wo Contrast Result Date: 10/28/2023 CLINICAL DATA:  Trauma, sudden onset headache, fall. EXAM: CT CERVICAL SPINE WITHOUT CONTRAST TECHNIQUE: Multidetector CT imaging of the cervical spine was performed without intravenous contrast. Multiplanar CT image reconstructions were also generated. RADIATION DOSE REDUCTION: This exam was performed according to the departmental dose-optimization program which includes automated exposure control, adjustment of the mA and/or kV according to patient size and/or use of iterative reconstruction technique. COMPARISON:  Same-day head CT. FINDINGS: Alignment: Straightening of the normal cervical lordosis. No significant listhesis. No facet subluxation or dislocation. Skull base and vertebrae: No compression fracture or displaced fracture in the cervical spine. No suspicious osseous lesion. Prominent degenerative endplate osteophytes particularly from C4-C7. Soft tissues and spinal canal: No prevertebral fluid or swelling. No visible canal hematoma. Disc levels: Mild intervertebral disc space narrowing at C5-6 and C6-7. Small disc bulges at multiple levels. There is mild spinal canal stenosis at C4-5. Disc osteophyte complexes at C5-6 and C6-7 resulting in mild spinal canal stenosis. Facet arthrosis and uncovertebral hypertrophy throughout the cervical spine. Foraminal narrowing is most pronounced on the right at C3-4. Upper chest: No acute abnormality. Other: None. IMPRESSION: No acute fracture or traumatic malalignment of the cervical spine. Degenerative changes as above. Electronically Signed   By: Denny Flack M.D.   On: 10/28/2023 14:43   CT HEAD CODE STROKE WO CONTRAST Result Date: 10/28/2023 CLINICAL DATA:  Code stroke.  Neuro deficit, concern for stroke. EXAM: CT HEAD WITHOUT CONTRAST TECHNIQUE:  Contiguous axial images were obtained from the base of the skull through the vertex without  intravenous contrast. RADIATION DOSE REDUCTION: This exam was performed according to the departmental dose-optimization program which includes automated exposure control, adjustment of the mA and/or kV according to patient size and/or use of iterative reconstruction technique. COMPARISON:  None Available. FINDINGS: Brain: No acute intracranial hemorrhage. Hypoattenuation and loss of gray-white matter differentiation within the anterior left frontal lobe within the left ACA territory for example on series 3, images 18 and 19. No significant edema, mass effect, or midline shift. Nonspecific hypoattenuation in the periventricular and subcortical white matter favored to reflect chronic microvascular ischemic changes. Possible small remote infarcts in the bilateral cerebellum. The basilar cisterns are patent. Ventricles: The ventricles are normal. Vascular: Atherosclerotic calcifications of the carotid siphons. No hyperdense vessel. Skull: No acute or aggressive finding. Orbits: Orbits are symmetric. Sinuses: Mucosal thickening and partial opacification of the right sphenoid sinus. Other: Trace fluid in the left mastoid air cells. ASPECTS [facility] Stroke Program Early CT Score) - Ganglionic level infarction (caudate, lentiform nuclei, internal capsule, insula, M1-M3 cortex): 7 - Supraganglionic infarction (M4-M6 cortex): 3 Total score (0-10 with 10 being normal): 10 IMPRESSION: 1. Small focus of acute infarct in the anterior left frontal lobe within the left ACA territory. 2. Mild chronic microvascular ischemic changes. There are possible small remote infarcts in the bilateral cerebellum. 3. Right sphenoid sinus disease. Recommend correlation for acute sinusitis. 4. ASPECTS is 10 These results were communicated to Dr. Doretta Gant At 2:30 pm on 10/28/2023 by text page via the [facility] messaging system. Electronically Signed   By: Denny Flack M.D.   On: 10/28/2023 14:30    Vitals:   10/29/23 0500 10/29/23 0515 10/29/23 0523 10/29/23 0600  BP: 130/62   119/67  Pulse: 74   61  Resp: 13   11  Temp:   97.8 F (36.6 C)   TempSrc:   Oral   SpO2:  95%  95%  Weight:      Height:         PHYSICAL EXAM General:  Alert, well-nourished, well-developed patient in no acute distress Psych:  Mood and affect appropriate for situation CV: Regular rate and rhythm on monitor Respiratory:  Regular, unlabored respirations on room air GI: Abdomen soft and nontender   NEURO:  Mental Status: AA&Ox3, patient is able to give clear and coherent history Speech/Language: speech is without dysarthria or aphasia.  Naming, repetition, fluency, and comprehension intact.  Cranial Nerves:  II: PERRL. Visual fields full.  III, IV, VI: EOMI. Eyelids elevate symmetrically.  V: Sensation is intact to light touch and symmetrical to face.  VII: Face is symmetrical resting and smiling VIII: hearing intact to voice. IX, X: Palate elevates symmetrically. Phonation is normal.  [id number] shrug 5/5. XII: tongue is midline without fasciculations. Motor: 5/5 strength to all muscle groups tested.  Tone: is normal and bulk is normal Sensation- Intact to light touch bilaterally. Extinction absent to light touch to DSS.   Coordination: FTN intact bilaterally, HKS: no ataxia in BLE.No drift.  Gait- deferred  Most Recent NIH 0  ASSESSMENT/PLAN  Michelle Kent is a 68 y.o. female with history of  HTN, HLD, hypothyroidism, syncope, anxiety who was brought in by EMS as a code stroke due to overall weakness, not alert or oriented.  Patient was at work and felt dizzy, had a syncopal event, afterwards was weak, confused   NIH on Admission 1  Syncope Code Stroke CT head  Small focus of acute infarct in the anterior left frontal lobe within the  left ACA territory.  Mild chronic microvascular ischemic changes. There are possible small remote infarcts in the  bilateral cerebellum. ASPECTS 10.    CTA head & neck No Lvo. 2 mm inferiorly directed outpouching along the right supraclinoid ICA which may reflect an infundibulum versus small aneurysm.   MRI  no acute process  2D Echo EF 65-70% LDL 44 HgbA1c 6.9 UDS negative VTE prophylaxis - Lovenox   aspirin  81 mg daily prior to admission, now on aspirin  81 mg daily Therapy recommendations: None Disposition: Home today  Hypertension Home meds:  lisinopril  5mg  Stable on the high end Gradually normalize BP in 2 to 3 days Long-term BP goal normotensive should  Hyperlipidemia Home meds:  crestor  40mg , resumed in hospital LDL 44, goal < 70 Continue statin at discharge  Diabetes type II, new diagnosis Home meds: None HgbA1c 6.9 goal < 7.0 CBGs SSI Recommend close follow-up with PCP for better DM control  Hypothyroidism Elevated TSH On home levothyroxine  88 mcg May consider increased dose, management per primary team  Other Stroke Risk Factors ETOH use, alcohol level <15, advised to drink no more than 1 drink(s) a day Obesity, Body mass index is 31.09 kg/m., BMI >/= 30 associated with increased stroke risk, recommend weight loss, diet and exercise as appropriate   Other Active Problems Hypothyroidism Anxiety  Hospital day # 0  Jonette Nestle DNP, ACNPC-AG  [facility]  ATTENDING NOTE: I reviewed above note and agree with the assessment and plan. Pt was seen and examined.   Husband and daughter are at bedside.  Patient no acute event overnight, currently neuro intact.  Per family, patient had syncope episode with altered mental status and general weakness when gain consciousness.  Most consistent with encephalopathy post syncope.  MRI negative for stroke.  Continue home aspirin  and Crestor .  TSH was high and likely uncontrolled hypothyroidism, management per primary team.  PT OT no recommendation.  For detailed assessment and plan, please refer to above as I have made  changes wherever appropriate.   Neurology will sign off. Please call with questions.  No neuro follow-up needed at this time.  Thanks for the consult.   Consuelo Denmark, MD PhD Stroke Neurology 10/29/2023 6:11 PM    To contact Stroke Continuity provider, please refer to [email or web address]. After hours, contact General Neurology

## 2023-10-29 NOTE — Discharge Summary (Signed)
 Physician Discharge Summary  Michelle Kent WUJ:[id number] DOB: 1956-01-07 DOA: 10/28/2023  PCP: Faustina Hood, MD  Admit date: 10/28/2023 Discharge date: 10/29/2023  Admitted From: Home  Discharge disposition: Home   Recommendations for Outpatient Follow-Up:   Follow up with your primary care provider in one week.  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   Hemoglobin A1c was 6.9 in the hospital.  Patient wishes to discuss with PCP regarding this.  Has been advised diabetic diet and lifestyle modification.   Discharge Diagnosis:   Principal Problem:   Syncope Active Problems:   CVA (cerebral vascular accident) (HCC)   Sinusitis   Headache   HTN (hypertension)   Hypothyroidism   HLD (hyperlipidemia)   Discharge Condition: Improved.  Diet recommendation: Low sodium, heart healthy.  Diabetic diet.  Wound care: None.  Code status: Full.   History of Present Illness:   Michelle Kent is a 68 y.o. female with past medical history significant of hypertension, hyperlipidemia, and hypothyroidism presented to hospital with dizziness confusion after fainting at work.  He also described severe headache located on the top of her head, feeling like she is 'on fire'.  Also complained of lateralized weakness and nausea. She has been experiencing significant stress related to family issues, particularly with her children and grandchildren.  She initially also had difficulty getting her words out and felt confused. In the emergency department patient presented as a code stroke.  Blood pressures noted to be elevated up to 186/85.  CT scan of the brain concerning for an acute infarct in the anterior left frontal lobe within the left MCA territory as well as right sphenoid sinus disease.  Patient was not a thrombolytics candidate due to being outside the window.  Labs were relatively unremarkable.  Neurology  recommended admission for completion of stroke workup.  Hospital Course:   Following conditions were addressed during hospitalization as listed below,  Syncopal episode likely vasovagal.   Initial CT noted concern for a possible acute infarct of the left anterior frontal lobe.  Patient was not a thrombolytics candidate due to low NIH.  CT angiogram of the head did not note any signs for a large vessel occlusion.  Neurology recommending admission for completion of stroke workup.  MRI of the brain did not show any evidence of stroke.  At this time patient has been cleared by neurology for discharge.  Stroke or TIA has been ruled out.  2D echocardiogram showed a normal LV function with no regional wall motion abnormality.  PT OT evaluation was done without any PT needs.  Patient was able to ambulate independently without any further issues..  Continue aspirin  and Plavix.   Possible sinusitis with headache. On Flonase and Augmentin will continue for few more days to complete the course.   Essential hypertension On lisinopril  5 mg at home.  Will resume on discharge.   Hypothyroidism On Biotene.  TSH elevated.  On thyroid  supplements.  Recommend checking as outpatient   Hyperlipidemia Continue Crestor .    Elevated hemoglobin A1c.  Fits into the criteria for diabetes melitis type II new diagnosis.  Patient stated that she was told that she was prediabetic and was on diet control.  I gave her the option of initiating on some form of treatment while in the hospital like metformin but at this time she wishes to follow-up with her primary care physician to discuss about it.  I have  however advised her to consider lifestyle modifications for weight loss and adjust her diet  Disposition.  At this time, patient is stable for disposition home with outpatient PCP follow-up  Medical Consultants:   Neurology  Procedures:    MRI of the brain/2D echocardiogram Subjective:   Today, patient was seen and  examined at bedside.  Denies any headache, dizziness, lightheadedness, shortness of breath, fever or chills.  Wishes to go home.  Discharge Exam:   Vitals:   10/29/23 1100 10/29/23 1124  BP: (!) 143/62 139/61  Pulse: 74 74  Resp: 17 18  Temp: 97.8 F (36.6 C) 97.6 F (36.4 C)  SpO2: 96% 95%   Vitals:   10/29/23 0523 10/29/23 0600 10/29/23 1100 10/29/23 1124  BP:  119/67 (!) 143/62 139/61  Pulse:  61 74 74  Resp:  11 17 18   Temp: 97.8 F (36.6 C)  97.8 F (36.6 C) 97.6 F (36.4 C)  TempSrc: Oral   Oral  SpO2:  95% 96% 95%  Weight:      Height:       Body mass index is 31.09 kg/m.  General: Alert awake, not in obvious distress, obese HENT: pupils equally reacting to light,  No scleral pallor or icterus noted. Oral mucosa is moist.  Chest:    Diminished breath sounds bilaterally. No crackles or wheezes.  CVS: S1 &S2 heard. No murmur.  Regular rate and rhythm. Abdomen: Soft, nontender, nondistended.  Bowel sounds are heard.   Extremities: No cyanosis, clubbing or edema.  Peripheral pulses are palpable. Psych: Alert, awake and oriented, normal mood CNS:  No cranial nerve deficits.  Power equal in all extremities.   Skin: Warm and dry.  No rashes noted.  The results of significant diagnostics from this hospitalization (including imaging, microbiology, ancillary and laboratory) are listed below for reference.     Diagnostic Studies:   ECHOCARDIOGRAM COMPLETE Result Date: 10/29/2023    ECHOCARDIOGRAM REPORT   Patient Name:   Michelle Kent Date of Exam: 10/29/2023 Medical Rec #:  [id number]  Height:       63.0 in Accession #:    [id number] Weight:       175.5 lb Date of Birth:  1955-12-16  BSA:          1.829 m Patient Age:    67 years   BP:           126/56 mmHg Patient Gender: F          HR:           64 bpm. Exam Location:  Inpatient Procedure: 2D Echo, Color Doppler and Cardiac Doppler (Both Spectral and Color            Flow Doppler were utilized during procedure). Indications:     Stroke I63.9  History:        Patient has no prior history of Echocardiogram examinations.                 Signs/Symptoms:Syncope; Risk Factors:Hypertension and                 Dyslipidemia.  Sonographer:    Kip Peon RDCS Referring Phys: [id number] ERIN C LEHNER IMPRESSIONS  1. Left ventricular ejection fraction, by estimation, is 65 to 70%. The left ventricle has normal function. The left ventricle has no regional wall motion abnormalities. Left ventricular diastolic parameters were normal.  2. Right ventricular systolic function is normal. The right ventricular size is normal.  3. The mitral  valve is normal in structure. No evidence of mitral valve regurgitation. No evidence of mitral stenosis.  4. The aortic valve is normal in structure. Aortic valve regurgitation is not visualized. No aortic stenosis is present.  5. The inferior vena cava is normal in size with greater than 50% respiratory variability, suggesting right atrial pressure of 3 mmHg. FINDINGS  Left Ventricle: Left ventricular ejection fraction, by estimation, is 65 to 70%. The left ventricle has normal function. The left ventricle has no regional wall motion abnormalities. The left ventricular internal cavity size was small. There is no left ventricular hypertrophy. Left ventricular diastolic parameters were normal. Right Ventricle: The right ventricular size is normal. No increase in right ventricular wall thickness. Right ventricular systolic function is normal. Left Atrium: Left atrial size was normal in size. Right Atrium: Right atrial size was normal in size. Pericardium: There is no evidence of pericardial effusion. Mitral Valve: The mitral valve is normal in structure. No evidence of mitral valve regurgitation. No evidence of mitral valve stenosis. Tricuspid Valve: The tricuspid valve is normal in structure. Tricuspid valve regurgitation is not demonstrated. No evidence of tricuspid stenosis. Aortic Valve: The aortic valve is normal in  structure. Aortic valve regurgitation is not visualized. No aortic stenosis is present. Pulmonic Valve: The pulmonic valve was normal in structure. Pulmonic valve regurgitation is not visualized. No evidence of pulmonic stenosis. Aorta: The aortic root is normal in size and structure. Venous: The inferior vena cava is normal in size with greater than 50% respiratory variability, suggesting right atrial pressure of 3 mmHg. IAS/Shunts: No atrial level shunt detected by color flow Doppler.  LEFT VENTRICLE PLAX 2D LVIDd:         5.00 cm   Diastology LVIDs:         2.90 cm   LV e' medial:    5.98 cm/s LV PW:         0.90 cm   LV E/e' medial:  15.3 LV IVS:        0.90 cm   LV e' lateral:   6.96 cm/s LVOT diam:     1.80 cm   LV E/e' lateral: 13.1 LV SV:         51 LV SV Index:   28 LVOT Area:     2.54 cm  RIGHT VENTRICLE             IVC RV S prime:     12.20 cm/s  IVC diam: 2.10 cm TAPSE (M-mode): 2.3 cm LEFT ATRIUM             Index LA diam:        3.20 cm 1.75 cm/m LA Vol (A2C):   70.0 ml 38.27 ml/m LA Vol (A4C):   27.5 ml 15.03 ml/m LA Biplane Vol: 44.0 ml 24.06 ml/m  AORTIC VALVE LVOT Vmax:   79.10 cm/s LVOT Vmean:  55.500 cm/s LVOT VTI:    0.201 m  AORTA Ao Root diam: 2.80 cm Ao Asc diam:  2.80 cm MITRAL VALVE MV Area (PHT): 3.72 cm    SHUNTS MV Decel Time: 204 msec    Systemic VTI:  0.20 m MV E velocity: 91.20 cm/s  Systemic Diam: 1.80 cm MV A velocity: 89.60 cm/s MV E/A ratio:  1.02 Arta Lark Electronically signed by Arta Lark Signature Date/Time: 10/29/2023/2:57:22 PM    Final    MR BRAIN WO CONTRAST Result Date: 10/28/2023 EXAM: MRI BRAIN WITHOUT CONTRAST 10/28/2023 04:58:28 PM TECHNIQUE: Multiplanar multisequence MRI of  the head/brain was performed without the administration of intravenous contrast. COMPARISON: CT head without contrast 10/28/2023 at 2:24 PM. CLINICAL HISTORY: Stroke, follow up. FINDINGS: BRAIN AND VENTRICLES: No acute infarct. No intracranial hemorrhage. No mass. No midline  shift. No hydrocephalus. The sella is unremarkable. Normal flow voids. ORBITS: No acute abnormality. SINUSES AND MASTOIDS: A fluid level is present within the right sphenoid sinus. A small left mastoid effusion is present. No obstructing nasopharyngeal lesion is present. BONES AND SOFT TISSUES: Normal marrow signal. No acute soft tissue abnormality. IMPRESSION: 1. No acute intracranial abnormality related to the stroke. 2. Fluid level within the right sphenoid sinus may represent acute infection 3. small left mastoid effusion. No obstructing nasopharyngeal lesion. Electronically signed by: Audree Leas MD 10/28/2023 05:20 PM EDT RP Workstation: [id number]   CT ANGIO HEAD NECK W WO CM Result Date: 10/28/2023 CLINICAL DATA:  Stroke/TIA, determine embolic source.  Headache. EXAM: CT ANGIOGRAPHY HEAD AND NECK WITH AND WITHOUT CONTRAST TECHNIQUE: Multidetector CT imaging of the head and neck was performed using the standard protocol during bolus administration of intravenous contrast. Multiplanar CT image reconstructions and MIPs were obtained to evaluate the vascular anatomy. Carotid stenosis measurements (when applicable) are obtained utilizing NASCET criteria, using the distal internal carotid diameter as the denominator. RADIATION DOSE REDUCTION: This exam was performed according to the departmental dose-optimization program which includes automated exposure control, adjustment of the mA and/or kV according to patient size and/or use of iterative reconstruction technique. CONTRAST:  75mL OMNIPAQUE  IOHEXOL  350 MG/ML SOLN COMPARISON:  Same day CT head. FINDINGS: CTA NECK FINDINGS Aortic arch: Four vessel configuration of the aortic arch. Imaged portion shows no evidence of aneurysm or dissection. Mild atherosclerosis. No significant stenosis of the major arch vessel origins. Pulmonary arteries: As permitted by contrast timing, there are no filling defects in the visualized pulmonary arteries. Subclavian  arteries: The subclavian arteries are patent bilaterally. Right carotid system: Patent. Mild atherosclerosis at the carotid bifurcation without hemodynamically significant stenosis. No evidence of dissection. Left carotid system: Patent. Mild atherosclerosis at the carotid bifurcation without hemodynamically significant stenosis. No evidence of dissection. Vertebral arteries: Codominant. No evidence of dissection, stenosis (50% or greater), or occlusion. Skeleton: No acute findings. Degenerative changes in the cervical spine. 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 Mild spinal canal stenosis in the lower cervical spine. Other neck: The visualized airway is patent. No cervical lymphadenopathy. Upper chest: Visualized lung apices are clear. Review of the MIP images confirms the above findings CTA HEAD FINDINGS ANTERIOR CIRCULATION: The intracranial ICAs are patent bilaterally. Mild atherosclerosis of the right carotid siphon without significant stenosis. There is a 2 mm inferiorly directed outpouching along the right supraclinoid ICA. No significant stenosis, proximal occlusion, or vascular malformation. MCAs: The middle cerebral arteries are patent bilaterally. ACAs: The anterior cerebral arteries are patent bilaterally. POSTERIOR CIRCULATION: No significant stenosis, proximal occlusion, aneurysm, or vascular malformation. PCAs: The posterior cerebral arteries are patent bilaterally. Pcomm: Not well visualized. SCAs: The superior cerebellar arteries are patent bilaterally. Basilar artery: Patent AICAs: Small vessel visualized on the right. PICAs: Visualized on the left. Vertebral arteries: The intracranial vertebral arteries are patent. Venous sinuses: As permitted by contrast timing, patent. Anatomic variants: None Review of the MIP images confirms the above findings IMPRESSION: No large vessel occlusion. No lesion amenable to  acute neurovascular intervention. Mild atherosclerosis as above. 2 mm inferiorly directed outpouching along the right supraclinoid ICA which may  reflect an infundibulum versus small aneurysm. Aortic Atherosclerosis (ICD10-I70.0). Electronically Signed   By: Denny Flack M.D.   On: 10/28/2023 16:43   CT Cervical Spine Wo Contrast Result Date: 10/28/2023 CLINICAL DATA:  Trauma, sudden onset headache, fall. EXAM: CT CERVICAL SPINE WITHOUT CONTRAST TECHNIQUE: Multidetector CT imaging of the cervical spine was performed without intravenous contrast. Multiplanar CT image reconstructions were also generated. RADIATION DOSE REDUCTION: This exam was performed according to the departmental dose-optimization program which includes automated exposure control, adjustment of the mA and/or kV according to patient size and/or use of iterative reconstruction technique. COMPARISON:  Same-day head CT. FINDINGS: Alignment: Straightening of the normal cervical lordosis. No significant listhesis. No facet subluxation or dislocation. Skull base and vertebrae: No compression fracture or displaced fracture in the cervical spine. No suspicious osseous lesion. Prominent degenerative endplate osteophytes particularly from C4-C7. Soft tissues and spinal canal: No prevertebral fluid or swelling. No visible canal hematoma. Disc levels: Mild intervertebral disc space narrowing at C5-6 and C6-7. Small disc bulges at multiple levels. There is mild spinal canal stenosis at C4-5. Disc osteophyte complexes at C5-6 and C6-7 resulting in mild spinal canal stenosis. Facet arthrosis and uncovertebral hypertrophy throughout the cervical spine. Foraminal narrowing is most pronounced on the right at C3-4. Upper chest: No acute abnormality. Other: None. IMPRESSION: No acute fracture or traumatic malalignment of the cervical spine. Degenerative changes as above. Electronically Signed   By: Denny Flack M.D.   On: 10/28/2023 14:43   CT HEAD CODE STROKE WO  CONTRAST Result Date: 10/28/2023 CLINICAL DATA:  Code stroke.  Neuro deficit, concern for stroke. EXAM: CT HEAD WITHOUT CONTRAST TECHNIQUE: Contiguous axial images were obtained from the base of the skull through the vertex without intravenous contrast. RADIATION DOSE REDUCTION: This exam was performed according to the departmental dose-optimization program which includes automated exposure control, adjustment of the mA and/or kV according to patient size and/or use of iterative reconstruction technique. COMPARISON:  None Available. FINDINGS: Brain: No acute intracranial hemorrhage. Hypoattenuation and loss of gray-white matter differentiation within the anterior left frontal lobe within the left ACA territory for example on series 3, images 18 and 19. No significant edema, mass effect, or midline shift. Nonspecific hypoattenuation in the periventricular and subcortical white matter favored to reflect chronic microvascular ischemic changes. Possible small remote infarcts in the bilateral cerebellum. The basilar cisterns are patent. Ventricles: The ventricles are normal. Vascular: Atherosclerotic calcifications of the carotid siphons. No hyperdense vessel. Skull: No acute or aggressive finding. Orbits: Orbits are symmetric. Sinuses: Mucosal thickening and partial opacification of the right sphenoid sinus. Other: Trace fluid in the left mastoid air cells. ASPECTS [facility] Stroke Program Early CT Score) - Ganglionic level infarction (caudate, lentiform nuclei, internal capsule, insula, M1-M3 cortex): 7 - Supraganglionic infarction (M4-M6 cortex): 3 Total score (0-10 with 10 being normal): 10 IMPRESSION: 1. Small focus of acute infarct in the anterior left frontal lobe within the left ACA territory. 2. Mild chronic microvascular ischemic changes. There are possible small remote infarcts in the bilateral cerebellum. 3. Right sphenoid sinus disease. Recommend correlation for acute sinusitis. 4. ASPECTS is 10 These results  were communicated to Dr. Doretta Gant At 2:30 pm on 10/28/2023 by text page via the [facility] messaging system. Electronically Signed   By: Denny Flack M.D.   On: 10/28/2023 14:30     Labs:   Basic Metabolic Panel: Recent Labs  Lab 10/28/23 1413 10/28/23 1416 10/29/23 0526  NA 139 139 138  K 3.9 3.7 3.9  CL 101 100 104  CO2  --  24 26  GLUCOSE 113* 112* 130*  BUN 13 11 10   CREATININE 0.70 0.73 0.83  CALCIUM   --  9.7 9.0   GFR Estimated Creatinine Clearance: 64.8 mL/min (by C-G formula based on SCr of 0.83 mg/dL). Liver Function Tests: Recent Labs  Lab 10/28/23 1416  AST 38  ALT 33  ALKPHOS 57  BILITOT 0.7  PROT 7.2  ALBUMIN 4.5   No results for input(s): "LIPASE", "AMYLASE" in the last 168 hours. No results for input(s): "AMMONIA" in the last 168 hours. Coagulation profile Recent Labs  Lab 10/28/23 1416  INR 1.0    CBC: Recent Labs  Lab 10/28/23 1413 10/28/23 1416 10/29/23 0526  WBC  --  6.5 6.4  NEUTROABS  --  3.6  --   HGB 13.9 13.7 12.9  HCT 41.0 40.1 38.9  MCV  --  93.7 95.1  PLT  --  205 191   Cardiac Enzymes: No results for input(s): "CKTOTAL", "CKMB", "CKMBINDEX", "TROPONINI" in the last 168 hours. BNP: Invalid input(s): "POCBNP" CBG: Recent Labs  Lab 10/28/23 1409  GLUCAP 110*   D-Dimer No results for input(s): "DDIMER" in the last 72 hours. Hgb A1c Recent Labs    10/28/23 1541  HGBA1C 6.9*   Lipid Profile Recent Labs    10/29/23 0526  CHOL 127  HDL 48  LDLCALC 44  TRIG 176*  CHOLHDL 2.6   Thyroid  function studies Recent Labs    10/29/23 0526  TSH 12.808*   Anemia work up No results for input(s): "VITAMINB12", "FOLATE", "FERRITIN", "TIBC", "IRON", "RETICCTPCT" in the last 72 hours. Microbiology No results found for this or any previous visit (from the past 240 hours).   Discharge Instructions:   Discharge Instructions     Diet - low sodium heart healthy   Complete by: As directed    Discharge instructions   Complete  by: As directed    Follow-up with your primary care provider in 1 week.  Check blood work at that time.  Complete course of antibiotic.  Seek medical attention for worsening symptoms.   Increase activity slowly   Complete by: As directed       Allergies as of 10/29/2023       Reactions   Nystatin Anaphylaxis   Codeine Nausea And Vomiting        Medication List     TAKE these medications    acetaminophen  500 MG tablet Commonly known as: TYLENOL  Take 1,000 mg by mouth every 6 (six) hours as needed for moderate pain.   albuterol  108 (90 Base) MCG/ACT inhaler Commonly known as: VENTOLIN  HFA Inhale 2 puffs into the lungs every 4 (four) hours as needed for wheezing or shortness of breath. Rinse mouth after use What changed: reasons to take this   amoxicillin-clavulanate 875-125 MG tablet Commonly known as: AUGMENTIN Take 1 tablet by mouth every 12 (twelve) hours for 5 days.   aspirin  EC 81 MG tablet Take 81 mg by mouth in the morning.   Biotin 1 MG Caps Take 1 capsule by mouth in the morning.   butalbital-acetaminophen -caffeine 50-325-40 MG tablet Commonly known as: FIORICET Take 1 tablet by mouth every 6 (six) hours as needed for headache.   cholecalciferol 10 MCG (400 UNIT) Tabs tablet Commonly known as: VITAMIN D3 Take 400 Units by mouth in the morning.   [employer] Use with the Albuterol  Inhaler   Crestor  40 MG tablet Generic drug: rosuvastatin   Take 40 mg by mouth at bedtime.   cyanocobalamin 100 MCG tablet Commonly known as: VITAMIN B12 Take 100 mcg by mouth in the morning.   Desvenlafaxine ER 50 MG Tb24 Commonly known as: PRISTIQ Take 1 tablet by mouth in the morning.   fluorouracil 5 % cream Commonly known as: EFUDEX Apply 1 Application topically 2 (two) times daily as needed.   fluticasone 50 MCG/ACT nasal spray Commonly known as: FLONASE Place 1 spray into both nostrils daily. Start taking on: October 30, 2023   levothyroxine  88  MCG tablet Commonly known as: SYNTHROID  Take 88 mcg by mouth daily before breakfast.   lisinopril  5 MG tablet Commonly known as: ZESTRIL  Take 5 mg by mouth at bedtime.   magnesium 30 MG tablet Take 30 mg by mouth in the morning.   naproxen  sodium 220 MG tablet Commonly known as: ALEVE  Take 440 mg by mouth 2 (two) times daily as needed.   Omega-3 1000 MG Caps Take 1,000 mg by mouth in the morning.   pantoprazole  40 MG tablet Commonly known as: Protonix  Take 1 tablet (40 mg total) by mouth daily. What changed:  when to take this reasons to take this   tiZANidine  2 MG tablet Commonly known as: ZANAFLEX  Take 1-2 tablets (2-4 mg total) by mouth every 6 (six) hours as needed for muscle spasms.   venlafaxine  XR 37.5 MG 24 hr capsule Commonly known as: EFFEXOR -XR Take 1 capsule by mouth in the morning.   vitamin C 1000 MG tablet Take 1,000 mg by mouth in the morning.          Time coordinating discharge: 39 minutes  Signed:  Caeli Linehan  [facility] 10/29/2023, 3:18 PM

## 2023-10-29 NOTE — Discharge Summary (Incomplete)
 Physician Discharge Summary   Patient: Michelle Kent MRN: [id number] DOB: 02-09-1956  Admit date:     10/28/2023  Discharge date: {dischdate:26783}  Discharge Physician: Rosena Conradi   PCP: Faustina Hood, MD   Recommendations at discharge:  {Tip this will not be part of the note when signed- Example include specific recommendations for outpatient follow-up, pending tests to follow-up on. (Optional):26781}  ***  Discharge Diagnoses: Principal Problem:   Syncope Active Problems:   CVA (cerebral vascular accident) (HCC)   Sinusitis   Headache   HTN (hypertension)   Hypothyroidism   HLD (hyperlipidemia)  Resolved Problems:   * No resolved hospital problems. *  Hospital Course: Michelle Kent is a 68 y.o. female with past medical history significant of hypertension, hyperlipidemia, and hypothyroidism presented to hospital with dizziness confusion after fainting at work.  He also described severe headache located on the top of her head, feeling like she is 'on fire'.  Also complained of lateralized weakness and nausea. She has been experiencing significant stress related to family issues, particularly with her children and grandchildren.  She initially also had difficulty getting her words out and felt confused. In the emergency department patient presented as a code stroke.  Blood pressures noted to be elevated up to 186/85.  CT scan of the brain concerning for an acute infarct in the anterior left frontal lobe within the left MCA territory as well as right sphenoid sinus disease.  Patient was not a thrombolytics candidate due to being outside the window.  Labs were relatively unremarkable.  Neurology recommended admission for completion of stroke workup.  Assessment and plan.  Syncopal episode  Suspected TIA/stroke   Initial CT noted concern for a possible acute infarct of the left anterior frontal lobe.  Patient was not a thrombolytics candidate due to low NIH.  CT angiogram of the head did  not note any signs for a large vessel occlusion.  Neurology recommending admission for completion of stroke workup.  MRI of the brain did not show any evidence of stroke.  Continue telemetry monitoring.  Pending 2D echocardiogram.  PT OT evaluation.  Continue aspirin  and Plavix.   Possible sinusitis On Flonase and Augmentin   Headache Patient reports having significant frontal headache.  Continue Sudafed - Fioricet as needed   Essential hypertension On lisinopril  5 mg at home.  Currently on permissive hypertension.   Hypothyroidism On Biotene.  TSH elevated.  On thyroid  supplements   Hyperlipidemia Continue Crestor .  Check lipid panel.  Assessment and Plan: No notes have been filed under this hospital service. Service: Hospitalist     {Tip this will not be part of the note when signed Body mass index is 31.09 kg/m. , ,  (Optional):26781}  {(NOTE) Pain control PDMP Statment (Optional):26782} Consultants: *** Procedures performed: ***  Disposition: {Plan; Disposition:26390} Diet recommendation:  Discharge Diet Orders (From admission, onward)     Start     Ordered   10/29/23 0000  Diet - low sodium heart healthy        10/29/23 1506           {Diet_Plan:26776} DISCHARGE MEDICATION: Allergies as of 10/29/2023       Reactions   Nystatin Anaphylaxis   Codeine Nausea And Vomiting        Medication List     TAKE these medications    acetaminophen  500 MG tablet Commonly known as: TYLENOL  Take 1,000 mg by mouth every 6 (six) hours as needed for moderate pain.   albuterol   108 (90 Base) MCG/ACT inhaler Commonly known as: VENTOLIN  HFA Inhale 2 puffs into the lungs every 4 (four) hours as needed for wheezing or shortness of breath. Rinse mouth after use What changed: reasons to take this   amoxicillin-clavulanate 875-125 MG tablet Commonly known as: AUGMENTIN Take 1 tablet by mouth every 12 (twelve) hours for 5 days.   aspirin  EC 81 MG tablet Take 81 mg by  mouth in the morning.   Biotin 1 MG Caps Take 1 capsule by mouth in the morning.   butalbital-acetaminophen -caffeine 50-325-40 MG tablet Commonly known as: FIORICET Take 1 tablet by mouth every 6 (six) hours as needed for headache.   cholecalciferol 10 MCG (400 UNIT) Tabs tablet Commonly known as: VITAMIN D3 Take 400 Units by mouth in the morning.   [employer] Use with the Albuterol  Inhaler   Crestor  40 MG tablet Generic drug: rosuvastatin  Take 40 mg by mouth at bedtime.   cyanocobalamin 100 MCG tablet Commonly known as: VITAMIN B12 Take 100 mcg by mouth in the morning.   Desvenlafaxine ER 50 MG Tb24 Commonly known as: PRISTIQ Take 1 tablet by mouth in the morning.   fluorouracil 5 % cream Commonly known as: EFUDEX Apply 1 Application topically 2 (two) times daily as needed.   fluticasone 50 MCG/ACT nasal spray Commonly known as: FLONASE Place 1 spray into both nostrils daily. Start taking on: October 30, 2023   levothyroxine  88 MCG tablet Commonly known as: SYNTHROID  Take 88 mcg by mouth daily before breakfast.   lisinopril  5 MG tablet Commonly known as: ZESTRIL  Take 5 mg by mouth at bedtime.   magnesium 30 MG tablet Take 30 mg by mouth in the morning.   naproxen  sodium 220 MG tablet Commonly known as: ALEVE  Take 440 mg by mouth 2 (two) times daily as needed.   Omega-3 1000 MG Caps Take 1,000 mg by mouth in the morning.   pantoprazole  40 MG tablet Commonly known as: Protonix  Take 1 tablet (40 mg total) by mouth daily. What changed:  when to take this reasons to take this   tiZANidine  2 MG tablet Commonly known as: ZANAFLEX  Take 1-2 tablets (2-4 mg total) by mouth every 6 (six) hours as needed for muscle spasms.   venlafaxine  XR 37.5 MG 24 hr capsule Commonly known as: EFFEXOR -XR Take 1 capsule by mouth in the morning.   vitamin C 1000 MG tablet Take 1,000 mg by mouth in the morning.        Follow-up Information     Faustina Hood, MD Follow up in 1 week(s).   Specialty: Family Medicine Contact information: [phone] [address] [place] [address] [phone]                Discharge Exam: Cleavon Curls Weights   10/28/23 1415 10/29/23 0143  Weight: 79.6 kg 79.6 kg   ***  Condition at discharge: {DC Condition:26389}  The results of significant diagnostics from this hospitalization (including imaging, microbiology, ancillary and laboratory) are listed below for reference.   Imaging Studies: ECHOCARDIOGRAM COMPLETE Result Date: 10/29/2023    ECHOCARDIOGRAM REPORT   Patient Name:   SIENA POEHLER Date of Exam: 10/29/2023 Medical Rec #:  [id number]  Height:       63.0 in Accession #:    [id number] Weight:       175.5 lb Date of Birth:  [birth year]  BSA:          1.829 m Patient Age:  67 years   BP:           126/56 mmHg Patient Gender: F          HR:           64 bpm. Exam Location:  Inpatient Procedure: 2D Echo, Color Doppler and Cardiac Doppler (Both Spectral and Color            Flow Doppler were utilized during procedure). Indications:    Stroke I63.9  History:        Patient has no prior history of Echocardiogram examinations.                 Signs/Symptoms:Syncope; Risk Factors:Hypertension and                 Dyslipidemia.  Sonographer:    Kip Peon RDCS Referring Phys: [id number] ERIN C LEHNER IMPRESSIONS  1. Left ventricular ejection fraction, by estimation, is 65 to 70%. The left ventricle has normal function. The left ventricle has no regional wall motion abnormalities. Left ventricular diastolic parameters were normal.  2. Right ventricular systolic function is normal. The right ventricular size is normal.  3. The mitral valve is normal in structure. No evidence of mitral valve regurgitation. No evidence of mitral stenosis.  4. The aortic valve is normal in structure. Aortic valve regurgitation is not visualized. No aortic stenosis is present.  5. The inferior vena cava is normal in size with greater  than 50% respiratory variability, suggesting right atrial pressure of 3 mmHg. FINDINGS  Left Ventricle: Left ventricular ejection fraction, by estimation, is 65 to 70%. The left ventricle has normal function. The left ventricle has no regional wall motion abnormalities. The left ventricular internal cavity size was small. There is no left ventricular hypertrophy. Left ventricular diastolic parameters were normal. Right Ventricle: The right ventricular size is normal. No increase in right ventricular wall thickness. Right ventricular systolic function is normal. Left Atrium: Left atrial size was normal in size. Right Atrium: Right atrial size was normal in size. Pericardium: There is no evidence of pericardial effusion. Mitral Valve: The mitral valve is normal in structure. No evidence of mitral valve regurgitation. No evidence of mitral valve stenosis. Tricuspid Valve: The tricuspid valve is normal in structure. Tricuspid valve regurgitation is not demonstrated. No evidence of tricuspid stenosis. Aortic Valve: The aortic valve is normal in structure. Aortic valve regurgitation is not visualized. No aortic stenosis is present. Pulmonic Valve: The pulmonic valve was normal in structure. Pulmonic valve regurgitation is not visualized. No evidence of pulmonic stenosis. Aorta: The aortic root is normal in size and structure. Venous: The inferior vena cava is normal in size with greater than 50% respiratory variability, suggesting right atrial pressure of 3 mmHg. IAS/Shunts: No atrial level shunt detected by color flow Doppler.  LEFT VENTRICLE PLAX 2D LVIDd:         5.00 cm   Diastology LVIDs:         2.90 cm   LV e' medial:    5.98 cm/s LV PW:         0.90 cm   LV E/e' medial:  15.3 LV IVS:        0.90 cm   LV e' lateral:   6.96 cm/s LVOT diam:     1.80 cm   LV E/e' lateral: 13.1 LV SV:         51 LV SV Index:   28 LVOT Area:     2.54 cm  RIGHT  VENTRICLE             IVC RV S prime:     12.20 cm/s  IVC diam: 2.10 cm  TAPSE (M-mode): 2.3 cm LEFT ATRIUM             Index LA diam:        3.20 cm 1.75 cm/m LA Vol (A2C):   70.0 ml 38.27 ml/m LA Vol (A4C):   27.5 ml 15.03 ml/m LA Biplane Vol: 44.0 ml 24.06 ml/m  AORTIC VALVE LVOT Vmax:   79.10 cm/s LVOT Vmean:  55.500 cm/s LVOT VTI:    0.201 m  AORTA Ao Root diam: 2.80 cm Ao Asc diam:  2.80 cm MITRAL VALVE MV Area (PHT): 3.72 cm    SHUNTS MV Decel Time: 204 msec    Systemic VTI:  0.20 m MV E velocity: 91.20 cm/s  Systemic Diam: 1.80 cm MV A velocity: 89.60 cm/s MV E/A ratio:  1.02 Arta Lark Electronically signed by Arta Lark Signature Date/Time: 10/29/2023/2:57:22 PM    Final    MR BRAIN WO CONTRAST Result Date: 10/28/2023 EXAM: MRI BRAIN WITHOUT CONTRAST 10/28/2023 04:58:28 PM TECHNIQUE: Multiplanar multisequence MRI of the head/brain was performed without the administration of intravenous contrast. COMPARISON: CT head without contrast 10/28/2023 at 2:24 PM. CLINICAL HISTORY: Stroke, follow up. FINDINGS: BRAIN AND VENTRICLES: No acute infarct. No intracranial hemorrhage. No mass. No midline shift. No hydrocephalus. The sella is unremarkable. Normal flow voids. ORBITS: No acute abnormality. SINUSES AND MASTOIDS: A fluid level is present within the right sphenoid sinus. A small left mastoid effusion is present. No obstructing nasopharyngeal lesion is present. BONES AND SOFT TISSUES: Normal marrow signal. No acute soft tissue abnormality. IMPRESSION: 1. No acute intracranial abnormality related to the stroke. 2. Fluid level within the right sphenoid sinus may represent acute infection 3. small left mastoid effusion. No obstructing nasopharyngeal lesion. Electronically signed by: Audree Leas MD 10/28/2023 05:20 PM EDT RP Workstation: [id number]   CT ANGIO HEAD NECK W WO CM Result Date: 10/28/2023 CLINICAL DATA:  Stroke/TIA, determine embolic source.  Headache. EXAM: CT ANGIOGRAPHY HEAD AND NECK WITH AND WITHOUT CONTRAST TECHNIQUE: Multidetector CT imaging of  the head and neck was performed using the standard protocol during bolus administration of intravenous contrast. Multiplanar CT image reconstructions and MIPs were obtained to evaluate the vascular anatomy. Carotid stenosis measurements (when applicable) are obtained utilizing NASCET criteria, using the distal internal carotid diameter as the denominator. RADIATION DOSE REDUCTION: This exam was performed according to the departmental dose-optimization program which includes automated exposure control, adjustment of the mA and/or kV according to patient size and/or use of iterative reconstruction technique. CONTRAST:  75mL OMNIPAQUE  IOHEXOL  350 MG/ML SOLN COMPARISON:  Same day CT head. FINDINGS: CTA NECK FINDINGS Aortic arch: Four vessel configuration of the aortic arch. Imaged portion shows no evidence of aneurysm or dissection. Mild atherosclerosis. No significant stenosis of the major arch vessel origins. Pulmonary arteries: As permitted by contrast timing, there are no filling defects in the visualized pulmonary arteries. Subclavian arteries: The subclavian arteries are patent bilaterally. Right carotid system: Patent. Mild atherosclerosis at the carotid bifurcation without hemodynamically significant stenosis. No evidence of dissection. Left carotid system: Patent. Mild atherosclerosis at the carotid bifurcation without hemodynamically significant stenosis. No evidence of dissection. Vertebral arteries: Codominant. No evidence of dissection, stenosis (50% or greater), or occlusion. Skeleton: No acute findings. Degenerative changes in the cervical spine. 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 Mild spinal canal stenosis in the lower cervical spine. Other neck: The visualized airway is patent. No cervical lymphadenopathy. Upper chest: Visualized lung apices are clear. Review of the MIP images confirms the above findings CTA HEAD  FINDINGS ANTERIOR CIRCULATION: The intracranial ICAs are patent bilaterally. Mild atherosclerosis of the right carotid siphon without significant stenosis. There is a 2 mm inferiorly directed outpouching along the right supraclinoid ICA. No significant stenosis, proximal occlusion, or vascular malformation. MCAs: The middle cerebral arteries are patent bilaterally. ACAs: The anterior cerebral arteries are patent bilaterally. POSTERIOR CIRCULATION: No significant stenosis, proximal occlusion, aneurysm, or vascular malformation. PCAs: The posterior cerebral arteries are patent bilaterally. Pcomm: Not well visualized. SCAs: The superior cerebellar arteries are patent bilaterally. Basilar artery: Patent AICAs: Small vessel visualized on the right. PICAs: Visualized on the left. Vertebral arteries: The intracranial vertebral arteries are patent. Venous sinuses: As permitted by contrast timing, patent. Anatomic variants: None Review of the MIP images confirms the above findings IMPRESSION: No large vessel occlusion. No lesion amenable to acute neurovascular intervention. Mild atherosclerosis as above. 2 mm inferiorly directed outpouching along the right supraclinoid ICA which may reflect an infundibulum versus small aneurysm. Aortic Atherosclerosis (ICD10-I70.0). Electronically Signed   By: Denny Flack M.D.   On: 10/28/2023 16:43   CT Cervical Spine Wo Contrast Result Date: 10/28/2023 CLINICAL DATA:  Trauma, sudden onset headache, fall. EXAM: CT CERVICAL SPINE WITHOUT CONTRAST TECHNIQUE: Multidetector CT imaging of the cervical spine was performed without intravenous contrast. Multiplanar CT image reconstructions were also generated. RADIATION DOSE REDUCTION: This exam was performed according to the departmental dose-optimization program which includes automated exposure control, adjustment of the mA and/or kV according to patient size and/or use of iterative reconstruction technique. COMPARISON:  Same-day head CT.  FINDINGS: Alignment: Straightening of the normal cervical lordosis. No significant listhesis. No facet subluxation or dislocation. Skull base and vertebrae: No compression fracture or displaced fracture in the cervical spine. No suspicious osseous lesion. Prominent degenerative endplate osteophytes particularly from C4-C7. Soft tissues and spinal canal: No prevertebral fluid or swelling. No visible canal hematoma. Disc levels: Mild intervertebral disc space narrowing at C5-6 and C6-7. Small disc bulges at multiple levels. There is mild spinal canal stenosis at C4-5. Disc osteophyte complexes at C5-6 and C6-7 resulting in mild spinal canal stenosis. Facet arthrosis and uncovertebral hypertrophy throughout the cervical spine. Foraminal narrowing is most pronounced on the right at C3-4. Upper chest: No acute abnormality. Other: None. IMPRESSION: No acute fracture or traumatic malalignment of the cervical spine. Degenerative changes as above. Electronically Signed   By: Denny Flack M.D.   On: 10/28/2023 14:43   CT HEAD CODE STROKE WO CONTRAST Result Date: 10/28/2023 CLINICAL DATA:  Code stroke.  Neuro deficit, concern for stroke. EXAM: CT HEAD WITHOUT CONTRAST TECHNIQUE: Contiguous axial images were obtained from the base of the skull through the vertex without intravenous contrast. RADIATION DOSE REDUCTION: This exam was performed according to the departmental dose-optimization program which includes automated exposure control, adjustment of the mA and/or kV according to patient size and/or use of iterative reconstruction technique. COMPARISON:  None Available. FINDINGS: Brain: No acute intracranial hemorrhage. Hypoattenuation and loss of gray-white matter differentiation within the anterior left frontal lobe within the left ACA territory for example on series 3, images 18 and 19. No significant edema, mass effect, or midline shift. Nonspecific hypoattenuation in the periventricular and subcortical white matter  favored to reflect chronic microvascular ischemic changes. Possible small remote  infarcts in the bilateral cerebellum. The basilar cisterns are patent. Ventricles: The ventricles are normal. Vascular: Atherosclerotic calcifications of the carotid siphons. No hyperdense vessel. Skull: No acute or aggressive finding. Orbits: Orbits are symmetric. Sinuses: Mucosal thickening and partial opacification of the right sphenoid sinus. Other: Trace fluid in the left mastoid air cells. ASPECTS [facility] Stroke Program Early CT Score) - Ganglionic level infarction (caudate, lentiform nuclei, internal capsule, insula, M1-M3 cortex): 7 - Supraganglionic infarction (M4-M6 cortex): 3 Total score (0-10 with 10 being normal): 10 IMPRESSION: 1. Small focus of acute infarct in the anterior left frontal lobe within the left ACA territory. 2. Mild chronic microvascular ischemic changes. There are possible small remote infarcts in the bilateral cerebellum. 3. Right sphenoid sinus disease. Recommend correlation for acute sinusitis. 4. ASPECTS is 10 These results were communicated to Dr. Doretta Gant At 2:30 pm on 10/28/2023 by text page via the [facility] messaging system. Electronically Signed   By: Denny Flack M.D.   On: 10/28/2023 14:30    Microbiology: Results for orders placed or performed during the hospital encounter of 07/09/14  Urine culture     Status: None   Collection Time: 07/09/14  1:26 AM   Specimen: Urine, Catheterized  Result Value Ref Range Status   Specimen Description URINE, CATHETERIZED  Final   Special Requests NONE  Final   Colony Count   Final    15,000 COLONIES/ML Performed at [facility]    Culture   Final    ESCHERICHIA COLI Performed at [facility]    Report Status 07/12/2014 FINAL  Final   Organism ID, Bacteria ESCHERICHIA COLI  Final      Susceptibility   Escherichia coli - MIC*    AMPICILLIN >=32 RESISTANT Resistant     CEFAZOLIN >=64 RESISTANT Resistant     CEFTRIAXONE <=1  SENSITIVE Sensitive     CIPROFLOXACIN  >=4 RESISTANT Resistant     GENTAMICIN <=1 SENSITIVE Sensitive     LEVOFLOXACIN >=8 RESISTANT Resistant     NITROFURANTOIN <=16 SENSITIVE Sensitive     TOBRAMYCIN <=1 SENSITIVE Sensitive     TRIMETH/SULFA <=20 SENSITIVE Sensitive     PIP/TAZO 8 SENSITIVE Sensitive     * ESCHERICHIA COLI    Labs: CBC: Recent Labs  Lab 10/28/23 1413 10/28/23 1416 10/29/23 0526  WBC  --  6.5 6.4  NEUTROABS  --  3.6  --   HGB 13.9 13.7 12.9  HCT 41.0 40.1 38.9  MCV  --  93.7 95.1  PLT  --  205 191   Basic Metabolic Panel: Recent Labs  Lab 10/28/23 1413 10/28/23 1416 10/29/23 0526  NA 139 139 138  K 3.9 3.7 3.9  CL 101 100 104  CO2  --  24 26  GLUCOSE 113* 112* 130*  BUN 13 11 10   CREATININE 0.70 0.73 0.83  CALCIUM   --  9.7 9.0   Liver Function Tests: Recent Labs  Lab 10/28/23 1416  AST 38  ALT 33  ALKPHOS 57  BILITOT 0.7  PROT 7.2  ALBUMIN 4.5   CBG: Recent Labs  Lab 10/28/23 1409  GLUCAP 110*    Discharge time spent: {LESS THAN/GREATER THAN:26388} 30 minutes.  Signed: Mikaelyn Arthurs, MD [facility] 10/29/2023

## 2023-10-29 NOTE — Care Management Obs Status (Signed)
 MEDICARE OBSERVATION STATUS NOTIFICATION   Patient Details  Name: Michelle Kent MRN: [id number] Date of Birth: 02-17-1956   Medicare Observation Status Notification Given:  Yes Signed and copy provided   Wynonia Hedges 10/29/2023, 3:21 PM

## 2023-10-29 NOTE — ED Notes (Signed)
 PT ambulated to the bathroom with family member

## 2023-10-29 NOTE — Progress Notes (Signed)
 SLP Cancellation Note  Patient Details Name: Michelle Kent MRN: [id number] DOB: 07-21-1955   Cancelled treatment:       Reason Eval/Treat Not Completed: SLP screened, no needs identified, will sign off. Pt and family report she passed a second screen last night, ate [employer] no problems. They have been feeding her outside food. They politely refuse SLP eval as unnecessary. MRI is negative. Pt feels speech, language and cognition at baseline. No diet officially ordered at this time. SLP will sign off.    Almer Bushey, Hardin Leys 10/29/2023, 10:06 AM

## 2023-10-29 NOTE — Evaluation (Signed)
 Occupational Therapy Evaluation Patient Details Name: Michelle Kent MRN: [id number] DOB: 1955/09/25 Today's Date: 10/29/2023   History of Present Illness   68 y.o. female adm 6/8 with medical history significant of hypertension, hyperlipidemia, and hypothyroidism presents with dizziness and confusion after fainting episode at work.  MR Brain:No acute intracranial abnormality related to the stroke.     Clinical Impressions Patient admitted for the diagnosis above.  Remains anxious about yesterdays events.  Patient is presenting at hr baseline for in room mobility/toileting and ADL completion.  Patient has been up and walking with family to the restroom.  No OT needs exist and no post acute recommendations.  Family can provide initial supervision of needed.  BEFAST education completed with good understanding.       If plan is discharge home, recommend the following:   Assist for transportation     Functional Status Assessment   Patient has not had a recent decline in their functional status     Equipment Recommendations   None recommended by OT     Recommendations for Other Services         Precautions/Restrictions   Precautions Precautions: None Restrictions Weight Bearing Restrictions Per Provider Order: No     Mobility Bed Mobility Overal bed mobility: Independent                  Transfers Overall transfer level: Modified independent Equipment used: None                      Balance Overall balance assessment: No apparent balance deficits (not formally assessed)                                         ADL either performed or assessed with clinical judgement   ADL Overall ADL's : At baseline                                             Vision Patient Visual Report: No change from baseline       Perception Perception: Within Functional Limits       Praxis Praxis: WFL       Pertinent  Vitals/Pain Pain Assessment Pain Assessment: No/denies pain     Extremity/Trunk Assessment Upper Extremity Assessment Upper Extremity Assessment: Overall WFL for tasks assessed   Lower Extremity Assessment Lower Extremity Assessment: Defer to PT evaluation   Cervical / Trunk Assessment Cervical / Trunk Assessment: Normal   Communication Communication Communication: No apparent difficulties   Cognition Arousal: Alert Behavior During Therapy: Anxious Cognition: No apparent impairments                               Following commands: Intact       Cueing  General Comments   Cueing Techniques: Verbal cues      Exercises     Shoulder Instructions      Home Living Family/patient expects to be discharged to:: Private residence Living Arrangements: Spouse/significant other Available Help at Discharge: Family;Available PRN/intermittently Type of Home: House Home Access: Stairs to enter [employer] of Steps: 2 Entrance Stairs-Rails: None Home Layout: Two level;Bed/bath upstairs     Bathroom Shower/Tub: Chief Strategy Officer:  Standard Bathroom Accessibility: Yes How Accessible: Accessible via walker Home Equipment: None          Prior Functioning/Environment Prior Level of Function : Independent/Modified Independent;Working/employed;Driving                    OT Problem List: Impaired balance (sitting and/or standing)   OT Treatment/Interventions:        OT Goals(Current goals can be found in the care plan section)   Acute Rehab OT Goals Patient Stated Goal: Return home OT Goal Formulation: With patient Time For Goal Achievement: 11/02/23 Potential to Achieve Goals: Good   OT Frequency:       Co-evaluation              AM-PAC OT "6 Clicks" Daily Activity     Outcome Measure Help from another person eating meals?: None Help from another person taking care of personal grooming?: None Help from  another person toileting, which includes using toliet, bedpan, or urinal?: None Help from another person bathing (including washing, rinsing, drying)?: None Help from another person to put on and taking off regular upper body clothing?: None Help from another person to put on and taking off regular lower body clothing?: None 6 Click Score: 24   End of Session Equipment Utilized During Treatment: Gait belt Nurse Communication: Mobility status  Activity Tolerance: Patient tolerated treatment well Patient left: in bed;with call bell/phone within reach;with family/visitor present  OT Visit Diagnosis: Unsteadiness on feet (R26.81)                Time: 1610-9604 OT Time Calculation (min): 22 min Charges:  OT General Charges $OT Visit: 1 Visit OT Evaluation $OT Eval Moderate Complexity: 1 Mod  10/29/2023  RP, OTR/L  Acute Rehabilitation Services  Office:  [phone]   Benjamen Brand 10/29/2023, 9:57 AM

## 2023-10-29 NOTE — Evaluation (Signed)
 Physical Therapy Brief Evaluation and Discharge Note Patient Details Name: Michelle Kent MRN: [id number] DOB: December 09, 1955 Today's Date: 10/29/2023   History of Present Illness  68 y.o. female adm 6/8 with medical history significant of hypertension, hyperlipidemia, and hypothyroidism presents with dizziness and confusion after fainting episode at work.  MR Brain:No acute intracranial abnormality related to the stroke.  Clinical Impression  Pt doing well with mobility and no further PT needed.  Ready for dc from PT standpoint.        PT Assessment Patient does not need any further PT services  Assistance Needed at Discharge  None    Equipment Recommendations None recommended by PT  Recommendations for Other Services       Precautions/Restrictions Precautions Precautions: None Restrictions Weight Bearing Restrictions Per Provider Order: No        Mobility  Bed Mobility Rolling: Independent Supine/Sidelying to sit: Independent Sit to supine/sidelying: Independent    Transfers Overall transfer level: Modified independent Equipment used: None                    Ambulation/Gait Ambulation/Gait assistance: Modified independent (Device/Increase time) Gait Distance (Feet): 250 Feet Assistive device: None Gait Pattern/deviations: Decreased stride length Gait Speed: Pace WFL General Gait Details: Steady gait  Home Activity Instructions    Stairs Stairs: Yes Stairs assistance: Modified independent (Device/Increase time) Stair Management: No rails, Forwards, Alternating pattern Number of Stairs: 10    Modified Rankin (Stroke Patients Only) Modified Rankin (Stroke Patients Only) Pre-Morbid Rankin Score: No symptoms Modified Rankin: No symptoms      Balance Overall balance assessment: Mild deficits observed, not formally tested                        Pertinent Vitals/Pain   Pain Assessment Pain Assessment: No/denies pain     Home Living  Family/patient expects to be discharged to:: Private residence Living Arrangements: Spouse/significant other Available Help at Discharge: Family Home Environment: Stairs to enter;Stairs in home  Stairs-Number of Steps: flight in home Home Equipment: None        Prior Function Level of Independence: Independent Comments: Works as Conservation officer, nature at [employer]    UE/LE Assessment   UE ROM/Strength/Tone/Coordination: [employer]    LE ROM/Strength/Tone/Coordination: [employer]      Communication   Communication Communication: No apparent difficulties     Cognition Overall Cognitive Status: Appears within functional limits for tasks assessed/performed       General Comments      Exercises     Assessment/Plan    PT Problem List         PT Visit Diagnosis Other abnormalities of gait and mobility (R26.89)    No Skilled PT Patient at baseline level of functioning;Patient is independent with all acitivity/mobility   Co-evaluation                AMPAC 6 Clicks Help needed turning from your back to your side while in a flat bed without using bedrails?: None Help needed moving from lying on your back to sitting on the side of a flat bed without using bedrails?: None Help needed moving to and from a bed to a chair (including a wheelchair)?: None Help needed standing up from a chair using your arms (e.g., wheelchair or bedside chair)?: None Help needed to walk in hospital room?: None Help needed climbing 3-5 steps with a railing? : None 6 Click Score: 24      End of Session  Activity Tolerance: Patient tolerated treatment well Patient left: in bed;with call bell/phone within reach;with family/visitor present   PT Visit Diagnosis: Other abnormalities of gait and mobility (R26.89)     Time: 0300-9233 PT Time Calculation (min) (ACUTE ONLY): 10 min  Charges:   PT Evaluation $PT Eval Low Complexity: 1 Low      [facility] PT Acute Rehabilitation Services Office  [phone]   Pura Browns [facility]  10/29/2023, 1:46 PM

## 2023-11-08 DIAGNOSIS — I671 Cerebral aneurysm, nonruptured: Secondary | ICD-10-CM | POA: Diagnosis not present

## 2023-11-08 DIAGNOSIS — R55 Syncope and collapse: Secondary | ICD-10-CM | POA: Diagnosis not present

## 2023-11-08 DIAGNOSIS — R790 Abnormal level of blood mineral: Secondary | ICD-10-CM | POA: Diagnosis not present

## 2023-11-08 DIAGNOSIS — E039 Hypothyroidism, unspecified: Secondary | ICD-10-CM | POA: Diagnosis not present

## 2023-11-08 DIAGNOSIS — E119 Type 2 diabetes mellitus without complications: Secondary | ICD-10-CM | POA: Diagnosis not present

## 2023-11-08 DIAGNOSIS — I10 Essential (primary) hypertension: Secondary | ICD-10-CM | POA: Diagnosis not present

## 2023-11-08 DIAGNOSIS — Z09 Encounter for follow-up examination after completed treatment for conditions other than malignant neoplasm: Secondary | ICD-10-CM | POA: Diagnosis not present

## 2023-12-03 DIAGNOSIS — M5090 Cervical disc disorder, unspecified, unspecified cervical region: Secondary | ICD-10-CM | POA: Diagnosis not present

## 2024-01-08 DIAGNOSIS — E039 Hypothyroidism, unspecified: Secondary | ICD-10-CM | POA: Diagnosis not present

## 2024-01-08 DIAGNOSIS — F5101 Primary insomnia: Secondary | ICD-10-CM | POA: Diagnosis not present

## 2024-01-08 DIAGNOSIS — I10 Essential (primary) hypertension: Secondary | ICD-10-CM | POA: Diagnosis not present

## 2024-01-08 DIAGNOSIS — Z0000 Encounter for general adult medical examination without abnormal findings: Secondary | ICD-10-CM | POA: Diagnosis not present

## 2024-01-08 DIAGNOSIS — K219 Gastro-esophageal reflux disease without esophagitis: Secondary | ICD-10-CM | POA: Diagnosis not present

## 2024-01-08 DIAGNOSIS — R7303 Prediabetes: Secondary | ICD-10-CM | POA: Diagnosis not present

## 2024-01-08 DIAGNOSIS — E782 Mixed hyperlipidemia: Secondary | ICD-10-CM | POA: Diagnosis not present

## 2024-03-06 ENCOUNTER — Other Ambulatory Visit: Payer: Self-pay | Admitting: Family Medicine

## 2024-03-06 DIAGNOSIS — Z1231 Encounter for screening mammogram for malignant neoplasm of breast: Secondary | ICD-10-CM

## 2024-03-13 DIAGNOSIS — E039 Hypothyroidism, unspecified: Secondary | ICD-10-CM | POA: Diagnosis not present

## 2024-03-27 ENCOUNTER — Ambulatory Visit

## 2024-04-24 ENCOUNTER — Ambulatory Visit
Admission: RE | Admit: 2024-04-24 | Discharge: 2024-04-24 | Disposition: A | Source: Ambulatory Visit | Attending: Family Medicine | Admitting: Family Medicine

## 2024-04-24 DIAGNOSIS — Z1231 Encounter for screening mammogram for malignant neoplasm of breast: Secondary | ICD-10-CM

## 2024-05-19 NOTE — Progress Notes (Signed)
 Elbert Polyakov                                          MRN: [id number]   05/19/2024   The [facility] Quality Team Specialist reviewed this patient medical record for the purposes of chart review for care gap closure. The following were reviewed: chart review for care gap closure-kidney health evaluation for diabetes:eGFR  and uACR.    [facility] Quality Team

## 2024-05-27 ENCOUNTER — Encounter (HOSPITAL_BASED_OUTPATIENT_CLINIC_OR_DEPARTMENT_OTHER): Payer: Self-pay | Admitting: Family Medicine
